# Patient Record
Sex: Female | Born: 1980 | Race: Black or African American | Hispanic: No | Marital: Single | Smoking: Never smoker
Health system: Southern US, Community
[De-identification: ages and names within clinical notes are randomized; demographics above are authoritative.]

## PROBLEM LIST (undated history)

## (undated) ENCOUNTER — Inpatient Hospital Stay (HOSPITAL_COMMUNITY): Payer: Self-pay

## (undated) DIAGNOSIS — D649 Anemia, unspecified: Secondary | ICD-10-CM

## (undated) DIAGNOSIS — F32A Depression, unspecified: Secondary | ICD-10-CM

## (undated) DIAGNOSIS — F329 Major depressive disorder, single episode, unspecified: Secondary | ICD-10-CM

## (undated) DIAGNOSIS — Z8619 Personal history of other infectious and parasitic diseases: Secondary | ICD-10-CM

## (undated) DIAGNOSIS — I10 Essential (primary) hypertension: Secondary | ICD-10-CM

## (undated) DIAGNOSIS — M797 Fibromyalgia: Secondary | ICD-10-CM

## (undated) DIAGNOSIS — T7840XA Allergy, unspecified, initial encounter: Secondary | ICD-10-CM

## (undated) DIAGNOSIS — K311 Adult hypertrophic pyloric stenosis: Secondary | ICD-10-CM

## (undated) HISTORY — PX: CHOLECYSTECTOMY: SHX55

## (undated) HISTORY — DX: Fibromyalgia: M79.7

## (undated) HISTORY — DX: Major depressive disorder, single episode, unspecified: F32.9

## (undated) HISTORY — DX: Allergy, unspecified, initial encounter: T78.40XA

## (undated) HISTORY — DX: Essential (primary) hypertension: I10

## (undated) HISTORY — DX: Depression, unspecified: F32.A

---
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
## 2003-11-19 ENCOUNTER — Other Ambulatory Visit: Admission: RE | Admit: 2003-11-19 | Discharge: 2003-11-19 | Payer: Self-pay | Admitting: Obstetrics & Gynecology

## 2003-12-20 ENCOUNTER — Inpatient Hospital Stay (HOSPITAL_COMMUNITY): Admission: AD | Admit: 2003-12-20 | Discharge: 2003-12-21 | Payer: Self-pay | Admitting: Obstetrics & Gynecology

## 2004-02-24 ENCOUNTER — Ambulatory Visit (HOSPITAL_COMMUNITY): Admission: RE | Admit: 2004-02-24 | Discharge: 2004-02-24 | Payer: Self-pay | Admitting: Obstetrics and Gynecology

## 2004-07-07 ENCOUNTER — Inpatient Hospital Stay (HOSPITAL_COMMUNITY): Admission: AD | Admit: 2004-07-07 | Discharge: 2004-07-09 | Payer: Self-pay | Admitting: Obstetrics & Gynecology

## 2005-11-20 ENCOUNTER — Emergency Department (HOSPITAL_COMMUNITY): Admission: EM | Admit: 2005-11-20 | Discharge: 2005-11-20 | Payer: Self-pay | Admitting: Emergency Medicine

## 2006-05-09 ENCOUNTER — Emergency Department (HOSPITAL_COMMUNITY): Admission: EM | Admit: 2006-05-09 | Discharge: 2006-05-09 | Payer: Self-pay | Admitting: Emergency Medicine

## 2006-08-19 ENCOUNTER — Ambulatory Visit (HOSPITAL_COMMUNITY): Admission: RE | Admit: 2006-08-19 | Discharge: 2006-08-20 | Payer: Self-pay | Admitting: General Surgery

## 2008-02-24 IMAGING — CT CT ABDOMEN W/ CM
1 of 3 series · 14 of 32 positions shown, 19 images · IV contrast (omnipaque)
Comparison: none

CLINICAL DATA: Epigastric pain.
 ABDOMEN CT WITH CONTRAST ? 05/09/06:
TECHNIQUE: Multidetector CT imaging of the abdomen was performed following the standard protocol during bolus administration of intravenous contrast. 
 Contrast:  125 cc Omnipaque 300 IV.
TECHNIQUE: Multidetector CT imaging of the pelvis was performed following the standard protocol during bolus administration of intravenous contrast.

[Series 2: abd/pel 5.0 b30f · axial · 0.62mm/px · z∈[+930,+1250]mm · 14 of 72 slices shown, 19 images]
[im 4/72  soft-tissue]
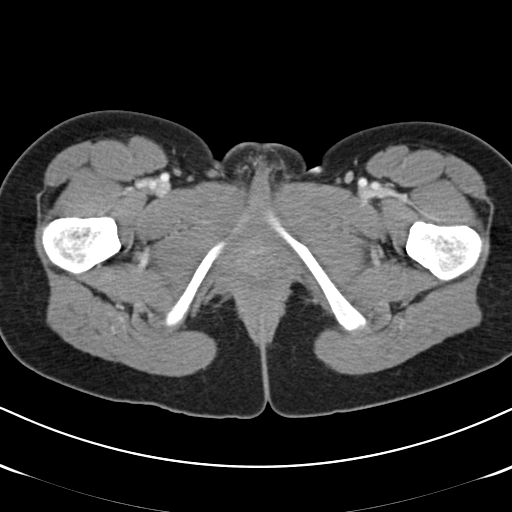
[im 4/72  bone]
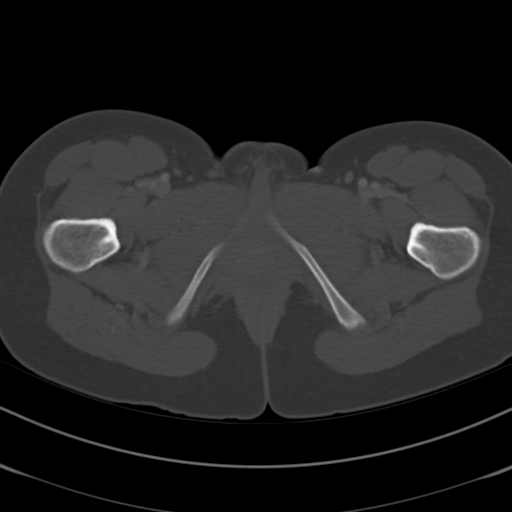
[im 11/72  soft-tissue]
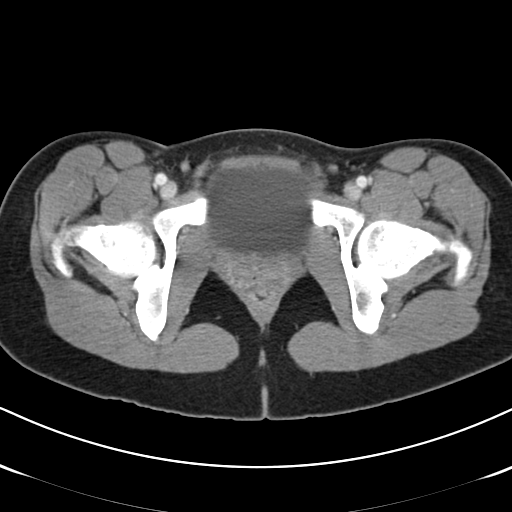
[im 14/72  soft-tissue]
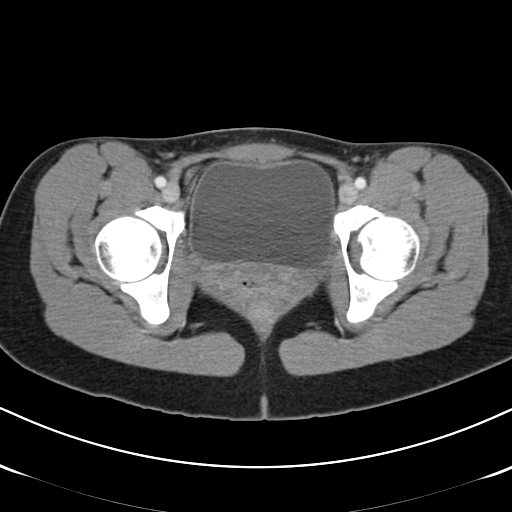
[im 21/72  soft-tissue]
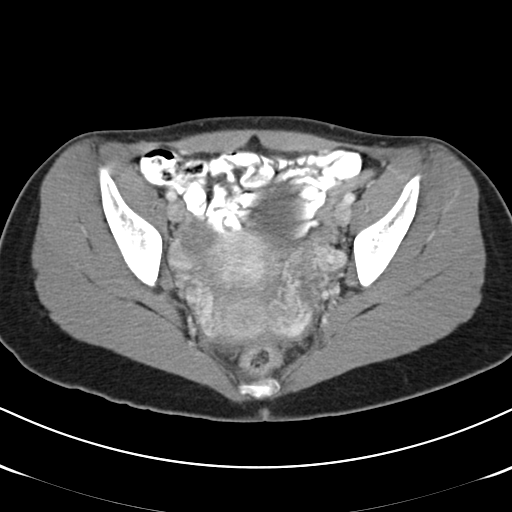
[im 24/72  soft-tissue]
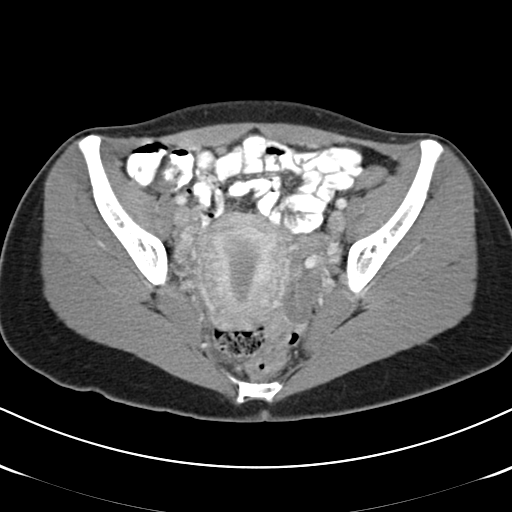
[im 31/72  soft-tissue]
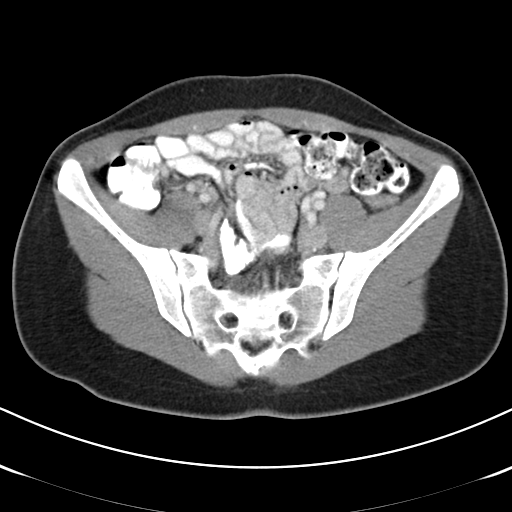
[im 38/72  soft-tissue]
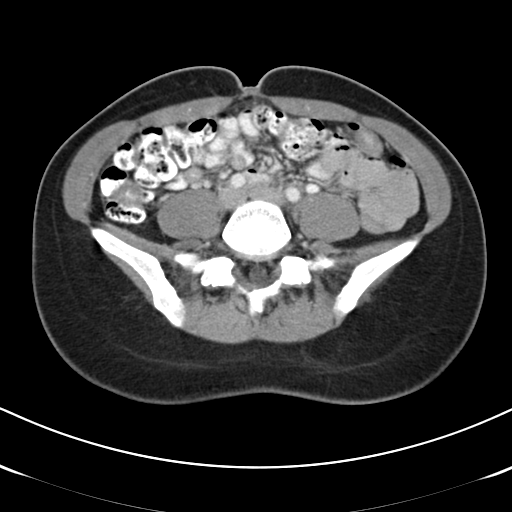
[im 41/72  soft-tissue]
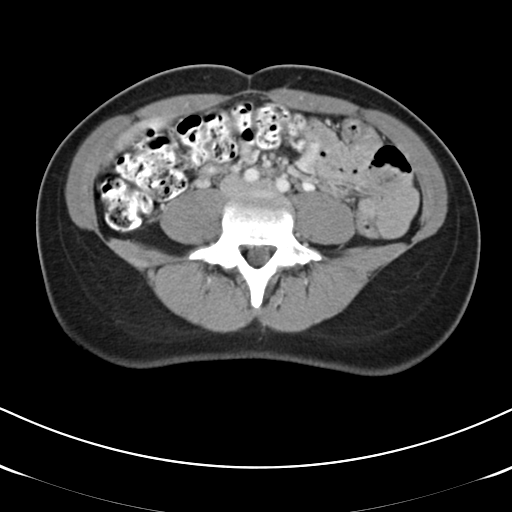
[im 48/72  soft-tissue]
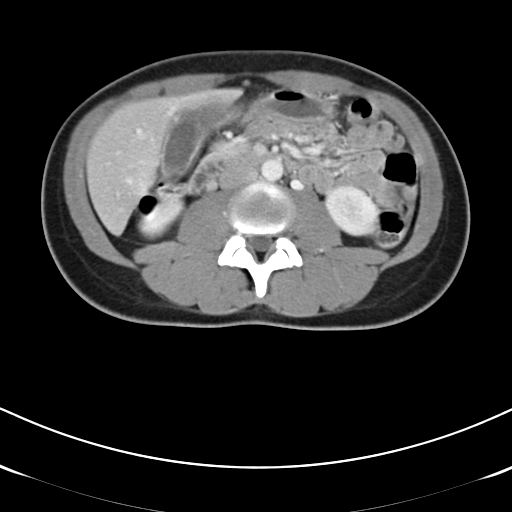
[im 48/72  bone]
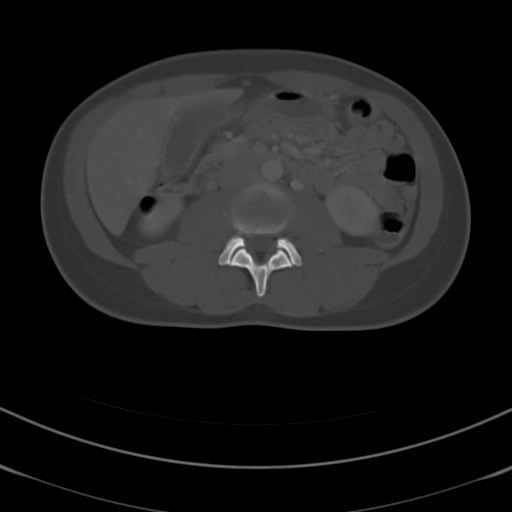
[im 51/72  soft-tissue]
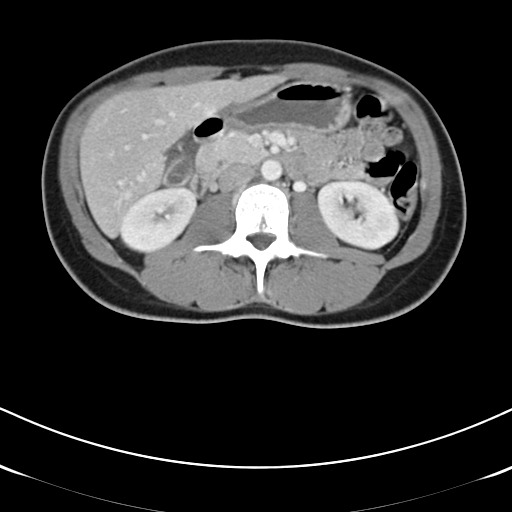
[im 58/72  soft-tissue]
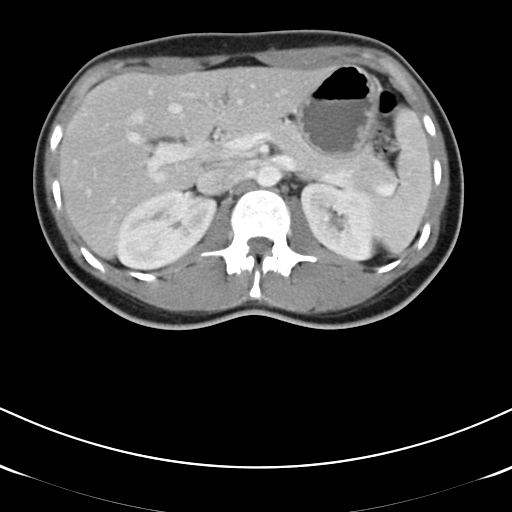
[im 58/72  lung]
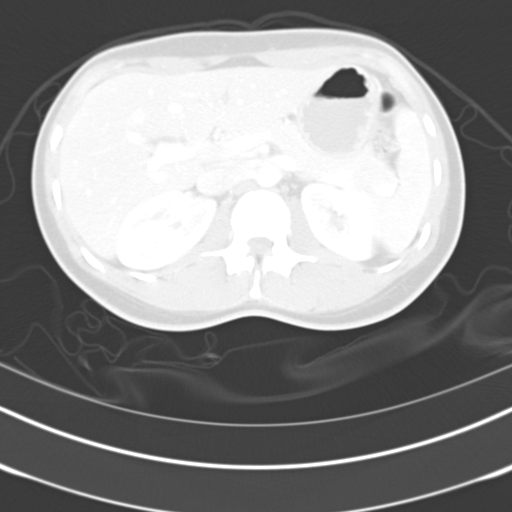
[im 61/72  soft-tissue]
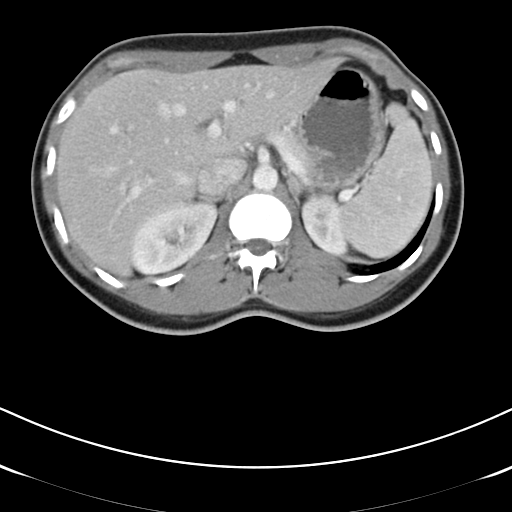
[im 61/72  lung]
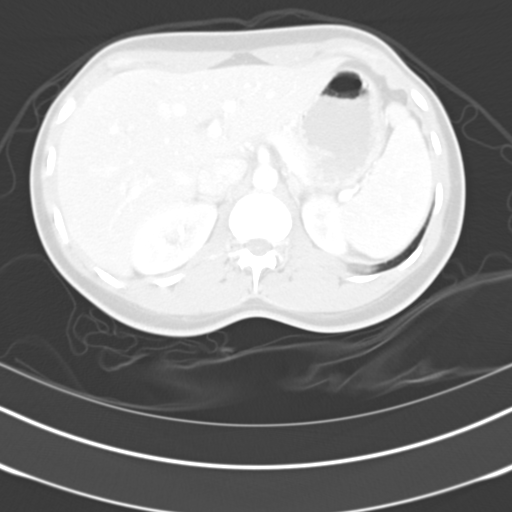
[im 65/72  lung]
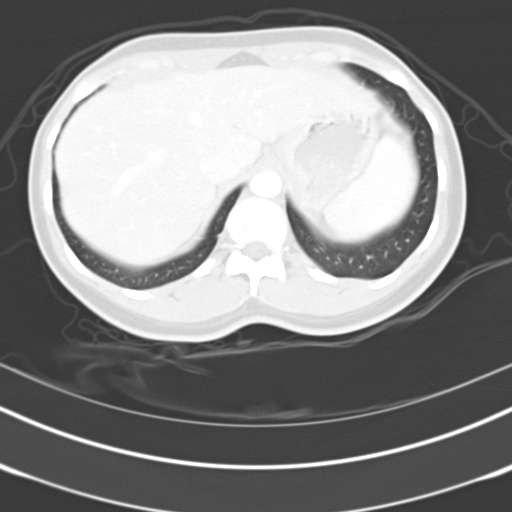
[im 68/72  soft-tissue]
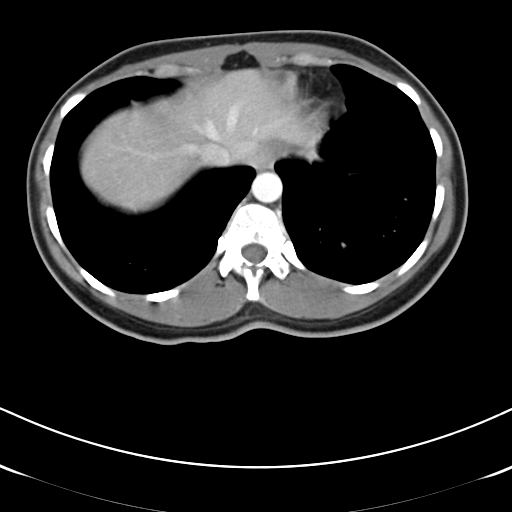
[im 68/72  lung]
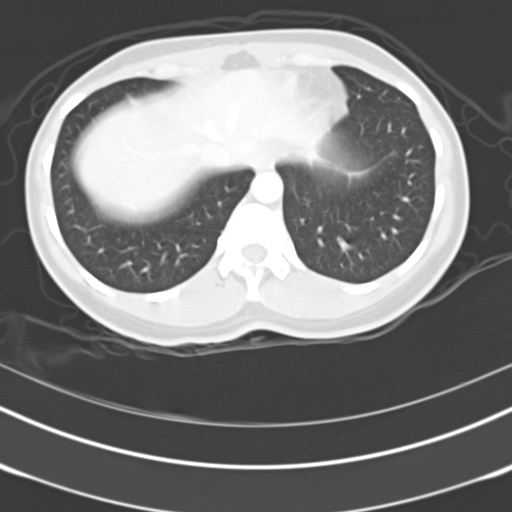

[14 of 32 positions shown; findings below may reference images not displayed]

FINDINGS: The gallbladder wall appears thickened and edematous.  Definite gallstones are not visualized by CT.  However, ultrasound is more sensitive for cholelithiasis and ultrasound of the abdomen is recommended for further evaluation. There is mild intrahepatic biliary dilatation.  The common bile duct is mildly prominent in size measuring 9 mm in diameter.  There are no focal hepatic abnormalities.  The spleen, pancreas, kidneys, and adrenal glands have a normal appearance.  There is no free peritoneal fluid or air.
IMPRESSION: Thickened gallbladder wall ? possible cholecystitis.  Also, mildly dilated common bile duct and intrahepatic biliary radicles.  Recommend ultrasound of the abdomen for further evaluation.
 PELVIS CT WITH CONTRAST ? 05/09/06:
FINDINGS: There is cystic change seen associated with the right ovary with a 3.5 cm in size mildly complicated right ovarian cyst noted.  This would be better evaluated by transvaginal pelvic ultrasound if felt to be indicated.  Minimal free pelvic fluid is noted.  There is no adenopathy.  The urinary bladder has a normal appearance.  The remainder of the CT of pelvis is negative.
IMPRESSION: 3.5 cm in size complicated cystic area associated with the right ovary ? consider transvaginal pelvic ultrasound for further evaluation of this.  Otherwise negative CT of the pelvis.

## 2011-11-24 ENCOUNTER — Other Ambulatory Visit: Payer: Self-pay | Admitting: Obstetrics and Gynecology

## 2012-03-03 ENCOUNTER — Ambulatory Visit (INDEPENDENT_AMBULATORY_CARE_PROVIDER_SITE_OTHER): Payer: Self-pay | Admitting: Internal Medicine

## 2012-03-03 VITALS — BP 161/98 | HR 80 | Temp 98.7°F | Resp 16 | Ht 63.5 in | Wt 158.0 lb

## 2012-03-03 DIAGNOSIS — M791 Myalgia, unspecified site: Secondary | ICD-10-CM

## 2012-03-03 DIAGNOSIS — J029 Acute pharyngitis, unspecified: Secondary | ICD-10-CM

## 2012-03-03 DIAGNOSIS — IMO0001 Reserved for inherently not codable concepts without codable children: Secondary | ICD-10-CM

## 2012-03-03 LAB — POCT CBC
Granulocyte percent: 63 %G (ref 37–80)
MCH, POC: 23.3 pg — AB (ref 27–31.2)
MCHC: 30.1 g/dL — AB (ref 31.8–35.4)
MPV: 8.6 fL (ref 0–99.8)
POC Granulocyte: 5 (ref 2–6.9)
Platelet Count, POC: 508 10*3/uL — AB (ref 142–424)
RBC: 4.61 M/uL (ref 4.04–5.48)
RDW, POC: 16.9 %

## 2012-03-03 LAB — POCT RAPID STREP A (OFFICE): Rapid Strep A Screen: NEGATIVE

## 2012-03-03 MED ORDER — AMOXICILLIN 500 MG PO CAPS: 500.0000 mg | ORAL_CAPSULE | Freq: Three times a day (TID) | ORAL | Status: AC

## 2012-03-03 MED ORDER — FLUCONAZOLE 150 MG PO TABS: 150.0000 mg | ORAL_TABLET | Freq: Once | ORAL | Status: AC

## 2012-03-03 MED ORDER — KETOROLAC TROMETHAMINE 10 MG PO TABS: 10.0000 mg | ORAL_TABLET | Freq: Four times a day (QID) | ORAL | Status: AC | PRN

## 2012-03-03 NOTE — Progress Notes (Signed)
Addended by: Glennie Isle on: 03/03/2012 01:35 PM   Modules accepted: Orders

## 2012-03-03 NOTE — Progress Notes (Signed)
  Subjective:    Patient ID: Kelly Barrett, female    DOB: 10-Sep-1981, 31 y.o.   MRN: [id number]  HPI Sore throat for 3 days  Moderate in severity Also co muscle aches body aches bilateral knee soreness  no fever has chills and    anorexia No radiation of pain in knees  No rash     Review of Systems  Constitutional: Positive for chills, activity change, appetite change and fatigue.  HENT: Negative.   Eyes: Negative.   Respiratory: Negative.   Cardiovascular: Negative.   Gastrointestinal: Negative.   Genitourinary: Negative.   Musculoskeletal: Positive for joint swelling.  Skin: Negative.   Neurological: Negative.   Hematological: Negative.   Psychiatric/Behavioral: Negative.   All other systems reviewed and are negative.       Objective:   Physical Exam  Nursing note and vitals reviewed. Constitutional: She is oriented to person, place, and time. She appears well-developed and well-nourished.  HENT:  Head: Normocephalic and atraumatic.  Right Ear: External ear normal.  Left Ear: External ear normal.       Pharyngeal injection no exudate  Eyes: Conjunctivae and EOM are normal. Pupils are equal, round, and reactive to light.  Neck: Normal range of motion. Neck supple.  Cardiovascular: Normal rate, regular rhythm and normal heart sounds.   Pulmonary/Chest: Effort normal and breath sounds normal.  Abdominal: Soft. Bowel sounds are normal.  Musculoskeletal: Normal range of motion.  Neurological: She is alert and oriented to person, place, and time. She has normal reflexes.  Skin: Skin is warm and dry.  Psychiatric: She has a normal mood and affect. Her behavior is normal. Judgment and thought content normal.     Results for orders placed in visit on 03/03/12  POCT CBC      Component Value Range   WBC 7.9  4.6 - 10.2 (K/uL)   Lymph, poc 2.4  0.6 - 3.4    POC LYMPH PERCENT 30.0  10 - 50 (%L)   MID (cbc) 0.6  0 - 0.9    POC MID % 7.0  0 - 12 (%M)   POC  Granulocyte 5.0  2 - 6.9    Granulocyte percent 63.0  37 - 80 (%G)   RBC 4.61  4.04 - 5.48 (M/uL)   Hemoglobin 10.7 (*) 12.2 - 16.2 (g/dL)   HCT, POC 40.9 (*) 81.1 - 47.9 (%)   MCV 77.2 (*) 80 - 97 (fL)   MCH, POC 23.3 (*) 27 - 31.2 (pg)   MCHC 30.1 (*) 31.8 - 35.4 (g/dL)   RDW, POC 91.4     Platelet Count, POC 508 (*) 142 - 424 (K/uL)   MPV 8.6  0 - 99.8 (fL)  POCT RAPID STREP A (OFFICE)      Component Value Range   Rapid Strep A Screen Negative  Negative       anemia normal wbc strep neg Assessment & Plan:  Pharyngitis will check cbc and strep and treat with appropriate meds.strep neg

## 2012-03-03 NOTE — Patient Instructions (Signed)
Take antibiotic as directed. You must use an alternative form of birth control for one month after taking the antibiotic. Toradol as directed for pain.

## 2012-04-06 ENCOUNTER — Encounter: Payer: Self-pay | Admitting: Family Medicine

## 2013-06-12 ENCOUNTER — Ambulatory Visit (INDEPENDENT_AMBULATORY_CARE_PROVIDER_SITE_OTHER): Payer: 59 | Admitting: Physician Assistant

## 2013-06-12 VITALS — BP 118/72 | HR 88 | Temp 98.9°F | Resp 18 | Ht 64.5 in | Wt 152.8 lb

## 2013-06-12 DIAGNOSIS — J309 Allergic rhinitis, unspecified: Secondary | ICD-10-CM

## 2013-06-12 MED ORDER — IPRATROPIUM BROMIDE 0.03 % NA SOLN: 2.0000 | Freq: Two times a day (BID) | NASAL | Status: DC

## 2013-06-12 MED ORDER — FLUTICASONE PROPIONATE 50 MCG/ACT NA SUSP: 2.0000 | Freq: Every day | NASAL | Status: DC

## 2013-06-12 NOTE — Patient Instructions (Signed)
Please consider getting a flu vaccine.  When your allergy symptoms are controlled, stop the Astelin (ipratropium) nasal spray, but continue the Flonase daily as allergy maintenance.  You may use an over the counter antihistamine, but I recommend that you avoid the decongestant products, which can be more drying.

## 2013-06-12 NOTE — Progress Notes (Signed)
  Subjective:    Patient ID: Kelly Barrett, female    DOB: 04-Aug-1981, 32 y.o.   MRN: [id number]  HPI This 32 y.o. female presents for evaluation of sinus pressure and congestion x 3 days.  History of allergies, but had no symptoms prior to the onset of this.  No cough.  A little ear pressure.  No fever/chills.  Feels achy in the joints (may be underlying fibromyalgia, but she's not sure).  Previously used OTC antihistamine + decongestant combinations, and tried Flonase before.  Allergies are seasonal, mostly in the fall.  Medications, allergies, past medical history, surgical history, family history, social history and problem list reviewed.   Review of Systems As above. No CP, SOB, dizziness.    Objective:   Physical Exam Blood pressure 118/72, pulse 88, temperature 98.9 F (37.2 C), temperature source Oral, resp. rate 18, height 5' 4.5" (1.638 m), weight 152 lb 12.8 oz (69.31 kg), last menstrual period 05/16/2013, SpO2 99.00%. Body mass index is 25.83 kg/(m^2). Well-developed, well nourished BF who is awake, alert and oriented, in NAD. HEENT: [place]/AT, PERRL, EOMI.  Sclera and conjunctiva are clear.  EAC are patent, TMs are normal in appearance. Nasal mucosa is congested, pink and moist. OP is clear. Neck: supple, non-tender, no lymphadenopathy, thyromegaly. Heart: RRR, no murmur Lungs: normal effort, CTA Extremities: no cyanosis, clubbing or edema. Skin: warm and dry without rash. Psychologic: good mood and appropriate affect, normal speech and behavior.        Assessment & Plan:  Allergic rhinitis - Plan: fluticasone (FLONASE) 50 MCG/ACT nasal spray, ipratropium (ATROVENT) 0.03 % nasal spray  Start with both nasal sprays, using the Atrovent followed by the Flonase.  Once her symptoms are controlled, stop the Atrovent and continue the Flonase for maintenance.  Fernande Bras, PA-C Physician Assistant-Certified [facility]

## 2013-06-13 ENCOUNTER — Telehealth: Payer: Self-pay | Admitting: Radiology

## 2013-06-13 NOTE — Telephone Encounter (Signed)
Work note provided for patient

## 2013-09-26 ENCOUNTER — Ambulatory Visit (INDEPENDENT_AMBULATORY_CARE_PROVIDER_SITE_OTHER): Payer: 59 | Admitting: Internal Medicine

## 2013-09-26 VITALS — BP 128/82 | HR 103 | Temp 98.8°F | Resp 17 | Ht 64.5 in | Wt 155.0 lb

## 2013-09-26 DIAGNOSIS — R05 Cough: Secondary | ICD-10-CM

## 2013-09-26 DIAGNOSIS — R059 Cough, unspecified: Secondary | ICD-10-CM

## 2013-09-26 DIAGNOSIS — J020 Streptococcal pharyngitis: Secondary | ICD-10-CM

## 2013-09-26 DIAGNOSIS — J060 Acute laryngopharyngitis: Secondary | ICD-10-CM

## 2013-09-26 MED ORDER — HYDROCODONE-ACETAMINOPHEN 7.5-325 MG/15ML PO SOLN: 10.0000 mL | Freq: Four times a day (QID) | ORAL | Status: DC | PRN

## 2013-09-26 MED ORDER — PREDNISONE 10 MG PO TABS: ORAL_TABLET | ORAL | Status: DC

## 2013-09-26 MED ORDER — AMOXICILLIN 500 MG PO CAPS: 1000.0000 mg | ORAL_CAPSULE | Freq: Two times a day (BID) | ORAL | Status: DC

## 2013-09-26 NOTE — Progress Notes (Signed)
   Subjective:    Patient ID: Kelly Barrett, female    DOB: 1981-03-21, 32 y.o.   MRN: [id number]  HPI  Kelly Barrett is a very pleasant 32 years old female patient who present to [facility] with Headache, sinus pressure, and very bad Cough. Coughing up some clear sputum. Also, complain of shortness of breath. Lost her voice 2 days ago. Denies any Fever, Abdominal pain. Is able to breathe without difficulty except at nite --croup  Review of Systems fibromyalgia    Objective:   Physical Exam  Vitals reviewed. Constitutional: She is oriented to person, place, and time. She appears well-developed and well-nourished. No distress.  HENT:  Head: Normocephalic.  Right Ear: External ear normal.  Left Ear: External ear normal.  Nose: Mucosal edema and rhinorrhea present. No sinus tenderness. No epistaxis. Foreign body is present.  Mouth/Throat: Uvula is midline. No uvula swelling. Posterior oropharyngeal erythema present. No oropharyngeal exudate or posterior oropharyngeal edema.  Eyes: EOM are normal. Pupils are equal, round, and reactive to light.  Cardiovascular: Normal rate, regular rhythm and normal heart sounds.   Pulmonary/Chest: Effort normal and breath sounds normal. No respiratory distress. She has no wheezes. She has no rales. She exhibits no tenderness.  Abdominal: Soft.  Neurological: She is alert and oriented to person, place, and time. No cranial nerve deficit. She exhibits normal muscle tone. Coordination normal.  Psychiatric: She has a normal mood and affect. Her behavior is normal. Thought content normal.    Results for orders placed in visit on 09/26/13  POCT RAPID STREP A (OFFICE)      Result Value Range   Rapid Strep A Screen Positive (*) Negative         Assessment & Plan:  Laryngitis/Cough/Painful throat Lortab elixir Prenisone taper 6d for croup Amoxil for strep

## 2013-09-26 NOTE — Progress Notes (Signed)
   Subjective:    Patient ID: Kelly Barrett, female    DOB: May 07, 1981, 32 y.o.   MRN: [id number]  HPI    Review of Systems     Objective:   Physical Exam        Assessment & Plan:

## 2013-09-26 NOTE — Patient Instructions (Addendum)
Cough, Adult  A cough is a reflex that helps clear your throat and airways. It can help heal the body or may be a reaction to an irritated airway. A cough may only last 2 or 3 weeks (acute) or may last more than 8 weeks (chronic).  CAUSES Acute cough:  Viral or bacterial infections. Chronic cough:  Infections.  Allergies.  Asthma.  Post-nasal drip.  Smoking.  Heartburn or acid reflux.  Some medicines.  Chronic lung problems (COPD).  Cancer. SYMPTOMS   Cough.  Fever.  Chest pain.  Increased breathing rate.  High-pitched whistling sound when breathing (wheezing).  Colored mucus that you cough up (sputum). TREATMENT   A bacterial cough may be treated with antibiotic medicine.  A viral cough must run its course and will not respond to antibiotics.  Your caregiver may recommend other treatments if you have a chronic cough. HOME CARE INSTRUCTIONS   Only take over-the-counter or prescription medicines for pain, discomfort, or fever as directed by your caregiver. Use cough suppressants only as directed by your caregiver.  Use a cold steam vaporizer or humidifier in your bedroom or home to help loosen secretions.  Sleep in a semi-upright position if your cough is worse at night.  Rest as needed.  Stop smoking if you smoke. SEEK IMMEDIATE MEDICAL CARE IF:   You have pus in your sputum.  Your cough starts to worsen.  You cannot control your cough with suppressants and are losing sleep.  You begin coughing up blood.  You have difficulty breathing.  You develop pain which is getting worse or is uncontrolled with medicine.  You have a fever. MAKE SURE YOU:   Understand these instructions.  Will watch your condition.  Will get help right away if you are not doing well or get worse. Document Released: 03/12/2011 Document Revised: 12/06/2011 Document Reviewed: 03/12/2011 [facility] Patient Information 2014 [place], [place]. Laryngitis At the top of your  windpipe is your voice box. It is the source of your voice. Inside your voice box are 2 bands of muscles called vocal cords. When you breathe, your vocal cords are relaxed and open so that air can get into the lungs. When you decide to say something, these cords come together and vibrate. The sound from these vibrations goes into your throat and comes out through your mouth as sound. Laryngitis is an inflammation of the vocal cords that causes hoarseness, cough, loss of voice, sore throat, and dry throat. Laryngitis can be temporary (acute) or long-term (chronic). Most cases of acute laryngitis improve with time.Chronic laryngitis lasts for more than 3 weeks. CAUSES Laryngitis can often be related to excessive smoking, talking, or yelling, as well as inhalation of toxic fumes and allergies. Acute laryngitis is usually caused by a viral infection, vocal strain, measles or mumps, or bacterial infections. Chronic laryngitis is usually caused by vocal cord strain, vocal cord injury, postnasal drip, growths on the vocal cords, or acid reflux. SYMPTOMS   Cough.  Sore throat.  Dry throat. RISK FACTORS  Respiratory infections.  Exposure to irritating substances, such as cigarette smoke, excessive amounts of alcohol, stomach acids, and workplace chemicals.  Voice trauma, such as vocal cord injury from shouting or speaking too loud. DIAGNOSIS  Your cargiver will perform a physical exam. During the physical exam, your caregiver will examine your throat. The most common sign of laryngitis is hoarseness. Laryngoscopy may be necessary to confirm the diagnosis of this condition. This procedure allows your caregiver to look into the  larynx. HOME CARE INSTRUCTIONS  Drink enough fluids to keep your urine clear or pale yellow.  Rest until you no longer have symptoms or as directed by your caregiver.  Breathe in moist air.  Take all medicine as directed by your caregiver.  Do not smoke.  Talk as little  as possible (this includes whispering).  Write on paper instead of talking until your voice is back to normal.  Follow up with your caregiver if your condition has not improved after 10 days. SEEK MEDICAL CARE IF:   You have trouble breathing.  You cough up blood.  You have persistent fever.  You have increasing pain.  You have difficulty swallowing. MAKE SURE YOU:  Understand these instructions.  Will watch your condition.  Will get help right away if you are not doing well or get worse. Document Released: 09/13/2005 Document Revised: 12/06/2011 Document Reviewed: 11/19/2010 [facility] Patient Information 2014 [place], [place]. Strep Throat Strep throat is an infection of the throat caused by a bacteria named Streptococcus pyogenes. Your caregiver may call the infection streptococcal "tonsillitis" or "pharyngitis" depending on whether there are signs of inflammation in the tonsils or back of the throat. Strep throat is most common in children aged 5 15 years during the cold months of the year, but it can occur in people of any age during any season. This infection is spread from person to person (contagious) through coughing, sneezing, or other close contact. SYMPTOMS   Fever or chills.  Painful, swollen, red tonsils or throat.  Pain or difficulty when swallowing.  White or yellow spots on the tonsils or throat.  Swollen, tender lymph nodes or "glands" of the neck or under the jaw.  Red rash all over the body (rare). DIAGNOSIS  Many different infections can cause the same symptoms. A test must be done to confirm the diagnosis so the right treatment can be given. A "rapid strep test" can help your caregiver make the diagnosis in a few minutes. If this test is not available, a light swab of the infected area can be used for a throat culture test. If a throat culture test is done, results are usually available in a day or two. TREATMENT  Strep throat is treated with antibiotic  medicine. HOME CARE INSTRUCTIONS   Gargle with 1 tsp of salt in 1 cup of warm water, 3 4 times per day or as needed for comfort.  Family members who also have a sore throat or fever should be tested for strep throat and treated with antibiotics if they have the strep infection.  Make sure everyone in your household washes their hands well.  Do not share food, drinking cups, or personal items that could cause the infection to spread to others.  You may need to eat a soft food diet until your sore throat gets better.  Drink enough water and fluids to keep your urine clear or pale yellow. This will help prevent dehydration.  Get plenty of rest.  Stay home from school, daycare, or work until you have been on antibiotics for 24 hours.  Only take over-the-counter or prescription medicines for pain, discomfort, or fever as directed by your caregiver.  If antibiotics are prescribed, take them as directed. Finish them even if you start to feel better. SEEK MEDICAL CARE IF:   The glands in your neck continue to enlarge.  You develop a rash, cough, or earache.  You cough up green, yellow-brown, or bloody sputum.  You have pain or discomfort  not controlled by medicines.  Your problems seem to be getting worse rather than better. SEEK IMMEDIATE MEDICAL CARE IF:   You develop any new symptoms such as vomiting, severe headache, stiff or painful neck, chest pain, shortness of breath, or trouble swallowing.  You develop severe throat pain, drooling, or changes in your voice.  You develop swelling of the neck, or the skin on the neck becomes red and tender.  You have a fever.  You develop signs of dehydration, such as fatigue, dry mouth, and decreased urination.  You become increasingly sleepy, or you cannot wake up completely. Document Released: 09/10/2000 Document Revised: 08/30/2012 Document Reviewed: 11/12/2010 [facility] Patient Information 2014 [place], [place].

## 2013-09-27 NOTE — L&D Delivery Note (Signed)
Delivery Note At 1:51 PM a viable and healthy female was delivered via  (Presentation: Right Occiput; Anterior).  APGAR: 9, 9; weight pending.   Placenta status: Intact, Spontaneous.  Cord: 3V  Anesthesia: Epidural  Episiotomy: None Lacerations: None Suture Repair: None Est. Blood Loss (mL): 300cc  Pt found to be complete after having a deceleration. I was called for delivery. Upon my arrival the patient was placed in lithotomy position. While I was attempting to put on my gloves the infants head delivered to crowning followed by the body. I was able to deliver the infant with one glove and a towel. The infant cried vigorously and was passed to the maternal abdomen. Placenta delivered spontaneously, intact, and 3V cord. Mom & baby are both doing well after delivery  Mom to postpartum.  Baby to Couplet care / Skin to Skin.  Horacio Werth H. 07/10/2014, 2:05 PM

## 2013-12-27 ENCOUNTER — Other Ambulatory Visit: Payer: Self-pay | Admitting: Obstetrics and Gynecology

## 2014-01-10 LAB — OB RESULTS CONSOLE ABO/RH: RH TYPE: POSITIVE

## 2014-01-10 LAB — OB RESULTS CONSOLE RUBELLA ANTIBODY, IGM: Rubella: IMMUNE

## 2014-01-10 LAB — OB RESULTS CONSOLE RPR: RPR: NONREACTIVE

## 2014-01-10 LAB — OB RESULTS CONSOLE ANTIBODY SCREEN: Antibody Screen: NEGATIVE

## 2014-01-10 LAB — OB RESULTS CONSOLE HEPATITIS B SURFACE ANTIGEN: HEP B S AG: NEGATIVE

## 2014-01-10 LAB — OB RESULTS CONSOLE HIV ANTIBODY (ROUTINE TESTING): HIV: NONREACTIVE

## 2014-06-26 ENCOUNTER — Encounter (HOSPITAL_COMMUNITY): Payer: Self-pay | Admitting: General Practice

## 2014-06-26 ENCOUNTER — Observation Stay (HOSPITAL_COMMUNITY)
Admission: AD | Admit: 2014-06-26 | Discharge: 2014-06-27 | Disposition: A | Discharge status: Home / Self Care | Payer: 59 | Source: Ambulatory Visit | Attending: Obstetrics and Gynecology | Admitting: Obstetrics and Gynecology

## 2014-06-26 ENCOUNTER — Other Ambulatory Visit: Payer: Self-pay | Admitting: Obstetrics and Gynecology

## 2014-06-26 DIAGNOSIS — O169 Unspecified maternal hypertension, unspecified trimester: Secondary | ICD-10-CM | POA: Diagnosis not present

## 2014-06-26 DIAGNOSIS — Z3A35 35 weeks gestation of pregnancy: Secondary | ICD-10-CM | POA: Insufficient documentation

## 2014-06-26 DIAGNOSIS — O10013 Pre-existing essential hypertension complicating pregnancy, third trimester: Principal | ICD-10-CM | POA: Insufficient documentation

## 2014-06-26 DIAGNOSIS — O163 Unspecified maternal hypertension, third trimester: Secondary | ICD-10-CM

## 2014-06-26 LAB — PROTEIN / CREATININE RATIO, URINE
CREATININE, URINE: 78.38 mg/dL
PROTEIN CREATININE RATIO: 0.1 (ref 0.00–0.15)
TOTAL PROTEIN, URINE: 8 mg/dL

## 2014-06-26 LAB — CBC
HEMATOCRIT: 29.8 % — AB (ref 36.0–46.0)
HEMOGLOBIN: 9.1 g/dL — AB (ref 12.0–15.0)
MCH: 23.1 pg — ABNORMAL LOW (ref 26.0–34.0)
MCHC: 30.5 g/dL (ref 30.0–36.0)
MCV: 75.6 fL — AB (ref 78.0–100.0)
Platelets: 320 10*3/uL (ref 150–400)
RBC: 3.94 MIL/uL (ref 3.87–5.11)
RDW: 17.6 % — AB (ref 11.5–15.5)
WBC: 7.6 10*3/uL (ref 4.0–10.5)

## 2014-06-26 LAB — COMPREHENSIVE METABOLIC PANEL
ALBUMIN: 2.7 g/dL — AB (ref 3.5–5.2)
ALK PHOS: 144 U/L — AB (ref 39–117)
ALT: 23 U/L (ref 0–35)
ANION GAP: 12 (ref 5–15)
AST: 26 U/L (ref 0–37)
BUN: 5 mg/dL — AB (ref 6–23)
CO2: 25 mEq/L (ref 19–32)
Calcium: 8.9 mg/dL (ref 8.4–10.5)
Chloride: 101 mEq/L (ref 96–112)
Creatinine, Ser: 0.67 mg/dL (ref 0.50–1.10)
GFR calc Af Amer: >90 mL/min (ref >90)
GFR calc non Af Amer: >90 mL/min (ref >90)
GLUCOSE: 81 mg/dL (ref 70–99)
Potassium: 3.9 mEq/L (ref 3.7–5.3)
SODIUM: 138 meq/L (ref 137–147)
TOTAL PROTEIN: 6.2 g/dL (ref 6.0–8.3)
Total Bilirubin: 0.2 mg/dL — ABNORMAL LOW (ref 0.3–1.2)

## 2014-06-26 LAB — LACTATE DEHYDROGENASE: LDH: 191 U/L (ref 94–250)

## 2014-06-26 LAB — URIC ACID: Uric Acid, Serum: 3.4 mg/dL (ref 2.4–7.0)

## 2014-06-26 NOTE — H&P (Signed)
Pt is a 33 y/o black female G2P1001 at 35 weeks who was sent over from the office today with elevated B/Ps. She was seen in the ER. She continued to have elevated B/Ps with diastolics in the low 100s. She has no symptoms. Labs were wnl. Will admit for observation and possible early delivery. Pt has a family h.o. HTN. Pt had a normal NIPT PMHX: see hollister PE: HEENT-wnl        Abd-gravid, non tender        EXTs-DTRs wnl        FHTs-reactive IMP/ IUP with HTN PLAN/ Admit for observation and possible induction.

## 2014-06-26 NOTE — MAU Provider Note (Signed)
History     CSN: [id number]  Arrival date and time: 06/26/14 1715   First Provider Initiated Contact with Patient 06/26/14 1924      Chief Complaint  Patient presents with  . Hypertension   HPI  [name] is a 33 y.o. female G2P1001 at 74w0dwho presents with high blood pressure. [facility] was seen in the office today [facility] +fetal movements. Denies leaking of fluid or vaginal bleeding.   OB History   Grav Para Term Preterm Abortions TAB SAB Ect Mult Living   2 1 1       1       Past Medical History  Diagnosis Date  . Allergy   . Fibromyalgia     DX back in HS 2002-2003    Past Surgical History  Procedure Laterality Date  . Cholecystectomy      Family History  Problem Relation Age of Onset  . Hypertension Mother   . Diabetes Father   . Hypertension Sister   . Asthma Son   . Allergies Son     History  Substance Use Topics  . Smoking status: Never Smoker   . Smokeless tobacco: Never Used  . Alcohol Use: No    Allergies: No Known Allergies  Prescriptions prior to admission  Medication Sig Dispense Refill  . amoxicillin (AMOXIL) 500 MG capsule Take 2 capsules (1,000 mg total) by mouth 2 (two) times daily.  40 capsule  0  . HYDROcodone-acetaminophen (HYCET) 7.5-325 mg/15 ml solution Take 10-15 mLs by mouth every 6 (six) hours as needed.  120 mL  0  . predniSONE (DELTASONE) 10 MG tablet 6-5-4-3-2-1 po pc for breathing  21 tablet  0   Results for orders placed during the hospital encounter of 06/26/14 (from the past 48 hour(s))  PROTEIN / CREATININE RATIO, URINE     Status: None   Collection Time    06/26/14  5:45 PM      Result Value Ref Range   Creatinine, Urine 78.38     Total Protein, Urine 8     Comment: NO NORMAL RANGE ESTABLISHED FOR THIS TEST   Protein Creatinine Ratio 0.10  0.00 - 0.15  CBC     Status: Abnormal   Collection Time    06/26/14  5:52 PM      Result Value Ref Range   WBC 7.6  4.0 - 10.5 K/uL   RBC 3.94  3.87 -  5.11 MIL/uL   Hemoglobin 9.1 (*) 12.0 - 15.0 g/dL   HCT 29.8 (*) 36.0 - 46.0 %   MCV 75.6 (*) 78.0 - 100.0 fL   MCH 23.1 (*) 26.0 - 34.0 pg   MCHC 30.5  30.0 - 36.0 g/dL   RDW 17.6 (*) 11.5 - 15.5 %   Platelets 320  150 - 400 K/uL  COMPREHENSIVE METABOLIC PANEL     Status: Abnormal   Collection Time    06/26/14  5:52 PM      Result Value Ref Range   Sodium 138  137 - 147 mEq/L   Potassium 3.9  3.7 - 5.3 mEq/L   Chloride 101  96 - 112 mEq/L   CO2 25  19 - 32 mEq/L   Glucose, Bld 81  70 - 99 mg/dL   BUN 5 (*) 6 - 23 mg/dL   Creatinine, Ser 0.67  0.50 - 1.10 mg/dL   Calcium 8.9  8.4 - 10.5 mg/dL   Total Protein 6.2  6.0 -  8.3 g/dL   Albumin 2.7 (*) 3.5 - 5.2 g/dL   AST 26  0 - 37 U/L   ALT 23  0 - 35 U/L   Alkaline Phosphatase 144 (*) 39 - 117 U/L   Total Bilirubin 0.2 (*) 0.3 - 1.2 mg/dL   GFR calc non Af Amer >90  >90 mL/min   GFR calc Af Amer >90  >90 mL/min   Comment: (NOTE)     The eGFR has been calculated using the CKD EPI equation.     This calculation has not been validated in all clinical situations.     eGFR's persistently <90 mL/min signify possible Chronic Kidney     Disease.   Anion gap 12  5 - 15  URIC ACID     Status: None   Collection Time    06/26/14  5:52 PM      Result Value Ref Range   Uric Acid, Serum 3.4  2.4 - 7.0 mg/dL  LACTATE DEHYDROGENASE     Status: None   Collection Time    06/26/14  5:52 PM      Result Value Ref Range   LDH 191  94 - 250 U/L     Review of Systems  Constitutional: Negative for fever and chills.  Eyes: Negative for blurred vision.  Cardiovascular: Negative for chest pain and leg swelling.  Gastrointestinal: Negative for nausea, vomiting and abdominal pain.  Neurological: Positive for dizziness (occasional ). Negative for headaches.   Physical Exam   Blood pressure 146/105, pulse 89, last menstrual period 11/07/2013.  Physical Exam  Constitutional: She is oriented to person, place, and time. She appears well-developed  and well-nourished. No distress.  HENT:  Head: Normocephalic.  Eyes: Pupils are equal, round, and reactive to light.  Neck: Neck supple.  Cardiovascular: Normal rate and normal heart sounds.   Respiratory: Effort normal and breath sounds normal.  GI: Soft. She exhibits no distension. There is no tenderness. There is no rebound and no guarding.  Musculoskeletal: Normal range of motion.       Right ankle: She exhibits no swelling.       Left ankle: She exhibits no swelling.  Neurological: She is alert and oriented to person, place, and time. She has normal reflexes.  Skin: Skin is warm. She is not diaphoretic.  Psychiatric: Her behavior is normal.    Fetal Tracing: Baseline: 120 bpm  Variability: moderate  Accelerations: 15x15 Decelerations: none Toco: Irregular contraction pattern.   MAU Course  Procedures None  MDM Protein/creatine  Uric acid CBC CMP  Discussed labs with Dr. Ouida Sills.  Dr. Ouida Sills spoke to RN> admit to [facility]   Assessment and Plan   A: Pregnancy induced hypertension  P: Admit to [facility] per Dr. Ouida Sills.   RASCH, [place] 06/26/2014, 7:25 PM

## 2014-06-26 NOTE — MAU Note (Signed)
Pt sent from office to r/o hypertension

## 2014-06-27 DIAGNOSIS — O1013 Pre-existing hypertensive heart disease complicating the puerperium: Secondary | ICD-10-CM | POA: Diagnosis present

## 2014-06-27 DIAGNOSIS — O10013 Pre-existing essential hypertension complicating pregnancy, third trimester: Secondary | ICD-10-CM | POA: Diagnosis not present

## 2014-06-27 DIAGNOSIS — Z3A35 35 weeks gestation of pregnancy: Secondary | ICD-10-CM | POA: Diagnosis not present

## 2014-06-27 NOTE — Discharge Summary (Signed)
Obstetric Discharge Summary Reason for Admission: observation/evaluation Prenatal Procedures: NST and ultrasound Intrapartum Procedures: NA, undelivered Postpartum Procedures: NA, undelivered Complications-Operative and Postpartum: NA undelivered Hemoglobin  Date Value Ref Range Status  06/26/2014 9.1* 12.0 - 15.0 g/dL Final  06/01/2840 32.4* 40.1 - 16.2 g/dL Final     HCT  Date Value Ref Range Status  06/26/2014 29.8* 36.0 - 46.0 % Final     HCT, POC  Date Value Ref Range Status  03/03/2012 35.6* 37.7 - 47.9 % Final    Physical Exam:  General: alert, cooperative and appears stated age FHR: 140-160 reactive, category 1 tracing Abdomen: Soft, gravid, NT  Results for orders placed during the hospital encounter of 06/26/14 (from the past 24 hour(s))  PROTEIN / CREATININE RATIO, URINE     Status: None   Collection Time    06/26/14  5:45 PM      Result Value Ref Range   Creatinine, Urine 78.38     Total Protein, Urine 8     Protein Creatinine Ratio 0.10  0.00 - 0.15  CBC     Status: Abnormal   Collection Time    06/26/14  5:52 PM      Result Value Ref Range   WBC 7.6  4.0 - 10.5 K/uL   RBC 3.94  3.87 - 5.11 MIL/uL   Hemoglobin 9.1 (*) 12.0 - 15.0 g/dL   HCT 02.7 (*) 25.3 - 66.4 %   MCV 75.6 (*) 78.0 - 100.0 fL   MCH 23.1 (*) 26.0 - 34.0 pg   MCHC 30.5  30.0 - 36.0 g/dL   RDW 40.3 (*) 47.4 - 25.9 %   Platelets 320  150 - 400 K/uL  COMPREHENSIVE METABOLIC PANEL     Status: Abnormal   Collection Time    06/26/14  5:52 PM      Result Value Ref Range   Sodium 138  137 - 147 mEq/L   Potassium 3.9  3.7 - 5.3 mEq/L   Chloride 101  96 - 112 mEq/L   CO2 25  19 - 32 mEq/L   Glucose, Bld 81  70 - 99 mg/dL   BUN 5 (*) 6 - 23 mg/dL   Creatinine, Ser 5.63  0.50 - 1.10 mg/dL   Calcium 8.9  8.4 - 87.5 mg/dL   Total Protein 6.2  6.0 - 8.3 g/dL   Albumin 2.7 (*) 3.5 - 5.2 g/dL   AST 26  0 - 37 U/L   ALT 23  0 - 35 U/L   Alkaline Phosphatase 144 (*) 39 - 117 U/L   Total Bilirubin 0.2  (*) 0.3 - 1.2 mg/dL   GFR calc non Af Amer >90  >90 mL/min   GFR calc Af Amer >90  >90 mL/min   Anion gap 12  5 - 15  URIC ACID     Status: None   Collection Time    06/26/14  5:52 PM      Result Value Ref Range   Uric Acid, Serum 3.4  2.4 - 7.0 mg/dL  LACTATE DEHYDROGENASE     Status: None   Collection Time    06/26/14  5:52 PM      Result Value Ref Range   LDH 191  94 - 250 U/L     Discharge Diagnoses: Gestational Hypertension without Severe Features  Discharge Information: Date: 06/27/2014 Activity: Home bedrest Diet: routine Medications: PNV Condition: stable Instructions: refer to practice specific booklet Discharge to: home Follow-up Information  Follow up with Almon HerculesOSS,Irja Wheless H., MD On 07/01/2014. (Monday @ 2:30 for an NST)    Specialty:  Obstetrics and Gynecology   Contact information:   [address] [place] [address] [phone]       Follow up with HORVATH,MICHELLE A, MD On 07/04/2014. (At 10:45 for an NST)    Specialty:  Obstetrics and Gynecology   Contact information:   [address] [place] [address] [phone]       Follow up with Philip AspenALLAHAN, SIDNEY, DO On 07/08/2014. (at 11:00 for an NST)    Specialty:  Obstetrics and Gynecology   Contact information:   [address] [place] [address] [phone]       Almon HerculesOSS,Dalphine Cowie H. 06/27/2014, 9:21 AM

## 2014-06-27 NOTE — Progress Notes (Signed)
Pt. Is stable and ready to be discharged home. All belongings with the patient. Reviewed discharge instructions and prescriptions reviewed. Pt. Wheeled out via wheelchair to [employer] car. Pt. To follow up at office Monday.

## 2014-06-27 NOTE — Discharge Instructions (Signed)

## 2014-07-04 ENCOUNTER — Inpatient Hospital Stay (HOSPITAL_COMMUNITY)
Admission: AD | Admit: 2014-07-04 | Discharge: 2014-07-04 | Disposition: A | Discharge status: Home / Self Care | Payer: 59 | Source: Ambulatory Visit | Attending: Obstetrics and Gynecology | Admitting: Obstetrics and Gynecology

## 2014-07-04 ENCOUNTER — Telehealth (HOSPITAL_COMMUNITY): Payer: Self-pay | Admitting: *Deleted

## 2014-07-04 ENCOUNTER — Encounter (HOSPITAL_COMMUNITY): Payer: Self-pay | Admitting: *Deleted

## 2014-07-04 DIAGNOSIS — Z3A36 36 weeks gestation of pregnancy: Secondary | ICD-10-CM | POA: Insufficient documentation

## 2014-07-04 DIAGNOSIS — O133 Gestational [pregnancy-induced] hypertension without significant proteinuria, third trimester: Secondary | ICD-10-CM | POA: Insufficient documentation

## 2014-07-04 DIAGNOSIS — O1213 Gestational proteinuria, third trimester: Secondary | ICD-10-CM | POA: Diagnosis present

## 2014-07-04 LAB — COMPREHENSIVE METABOLIC PANEL
ALBUMIN: 2.9 g/dL — AB (ref 3.5–5.2)
ALT: 19 U/L (ref 0–35)
AST: 15 U/L (ref 0–37)
Alkaline Phosphatase: 145 U/L — ABNORMAL HIGH (ref 39–117)
Anion gap: 11 (ref 5–15)
BILIRUBIN TOTAL: 0.3 mg/dL (ref 0.3–1.2)
BUN: 6 mg/dL (ref 6–23)
CHLORIDE: 103 meq/L (ref 96–112)
CO2: 22 mEq/L (ref 19–32)
Calcium: 8.2 mg/dL — ABNORMAL LOW (ref 8.4–10.5)
Creatinine, Ser: 0.63 mg/dL (ref 0.50–1.10)
GFR calc Af Amer: >90 mL/min (ref >90)
GFR calc non Af Amer: >90 mL/min (ref >90)
Glucose, Bld: 68 mg/dL — ABNORMAL LOW (ref 70–99)
POTASSIUM: 4 meq/L (ref 3.7–5.3)
Sodium: 136 mEq/L — ABNORMAL LOW (ref 137–147)
Total Protein: 6.6 g/dL (ref 6.0–8.3)

## 2014-07-04 LAB — CBC
HCT: 30.6 % — ABNORMAL LOW (ref 36.0–46.0)
Hemoglobin: 9.4 g/dL — ABNORMAL LOW (ref 12.0–15.0)
MCH: 23 pg — ABNORMAL LOW (ref 26.0–34.0)
MCHC: 30.7 g/dL (ref 30.0–36.0)
MCV: 75 fL — ABNORMAL LOW (ref 78.0–100.0)
PLATELETS: 318 10*3/uL (ref 150–400)
RBC: 4.08 MIL/uL (ref 3.87–5.11)
RDW: 17.4 % — AB (ref 11.5–15.5)
WBC: 8.5 10*3/uL (ref 4.0–10.5)

## 2014-07-04 LAB — PROTEIN / CREATININE RATIO, URINE
CREATININE, URINE: 287.67 mg/dL
Protein Creatinine Ratio: 0.1 (ref 0.00–0.15)
Total Protein, Urine: 27.7 mg/dL

## 2014-07-04 NOTE — MAU Provider Note (Signed)
Chief Complaint:  Proteinuria       HPI: Kelly Barrett is a 33 y.o. G2P1001 at [redacted]w[redacted]d who is sent over after office visit for further  evaluation for preeclampsia due to dipstick proteinuria. Mild H/A this am, still mild but declines tx now. No visual disturbance or upper abd pain.  Denies contractions, leakage of fluid or vaginal bleeding. Good fetal movement.   Pregnancy Course: BP elevations x 1 wk, fibroid  Past Medical History: Past Medical History  Diagnosis Date  . Allergy   . Fibromyalgia     DX back in HS 2002-2003    Past obstetric history: OB History  Gravida Para Term Preterm AB SAB TAB Ectopic Multiple Living  2 1 1       1     # Outcome Date GA Lbr Len/2nd Weight Sex Delivery Anes PTL Lv  2 CUR           1 TRM 07/07/04    M SVD EPI N Y      Past Surgical History: Past Surgical History  Procedure Laterality Date  . Cholecystectomy       Family History: Family History  Problem Relation Age of Onset  . Hypertension Mother   . Diabetes Father   . Hypertension Sister   . Asthma Son   . Allergies Son     Social History: History  Substance Use Topics  . Smoking status: Never Smoker   . Smokeless tobacco: Never Used  . Alcohol Use: No    Allergies: No Known Allergies  Meds:  Prescriptions prior to admission  Medication Sig Dispense Refill  . FeBisg-FeCbn-C-FA-B12-Biot-DSS (FERIVA PO) Take 1 capsule by mouth daily.      . Prenatal Vit-Fe Fumarate-FA (PRENATAL MULTIVITAMIN) TABS tablet Take 1 tablet by mouth daily at 12 noon.        ROS: Pertinent findings in history of present illness.  Physical Exam  Height 5\' 3"  (1.6 m), weight 71.759 kg (158 lb 3.2 oz), last menstrual period 11/07/2013. Filed Vitals:   07/04/14 1227 07/04/14 1242 07/04/14 1257 07/04/14 1312  BP: 141/100 136/88 144/96 140/94  Pulse: 100 87 95 103  Temp:      TempSrc:      Resp:      Height:      Weight:       GENERAL: Well-developed, well-nourished female in no acute  distress.  HEENT: normocephalic HEART: normal rate RESP: normal effort ABDOMEN: Soft, non-tender, gravid appropriate for gestational age, cephalic by exam EXTREMITIES: Nontender, no edema NEURO: alert and oriented     FHT:  Baseline 120-125 , moderate variability, accelerations present, no decelerations Contractions: occ, mild   Labs: Results for orders placed during the hospital encounter of 07/04/14 (from the past 24 hour(s))  PROTEIN / CREATININE RATIO, URINE     Status: None   Collection Time    07/04/14 12:00 PM      Result Value Ref Range   Creatinine, Urine 287.67     Total Protein, Urine 27.7     Protein Creatinine Ratio 0.10  0.00 - 0.15  CBC     Status: Abnormal   Collection Time    07/04/14 12:30 PM      Result Value Ref Range   WBC 8.5  4.0 - 10.5 K/uL   RBC 4.08  3.87 - 5.11 MIL/uL   Hemoglobin 9.4 (*) 12.0 - 15.0 g/dL   HCT 16.1 (*) 09.6 - 04.5 %   MCV 75.0 (*) 78.0 -  100.0 fL   MCH 23.0 (*) 26.0 - 34.0 pg   MCHC 30.7  30.0 - 36.0 g/dL   RDW 16.117.4 (*) 09.611.5 - 04.515.5 %   Platelets 318  150 - 400 K/uL  COMPREHENSIVE METABOLIC PANEL     Status: Abnormal   Collection Time    07/04/14 12:30 PM      Result Value Ref Range   Sodium 136 (*) 137 - 147 mEq/L   Potassium 4.0  3.7 - 5.3 mEq/L   Chloride 103  96 - 112 mEq/L   CO2 22  19 - 32 mEq/L   Glucose, Bld 68 (*) 70 - 99 mg/dL   BUN 6  6 - 23 mg/dL   Creatinine, Ser 4.090.63  0.50 - 1.10 mg/dL   Calcium 8.2 (*) 8.4 - 10.5 mg/dL   Total Protein 6.6  6.0 - 8.3 g/dL   Albumin 2.9 (*) 3.5 - 5.2 g/dL   AST 15  0 - 37 U/L   ALT 19  0 - 35 U/L   Alkaline Phosphatase 145 (*) 39 - 117 U/L   Total Bilirubin 0.3  0.3 - 1.2 mg/dL   GFR calc non Af Amer >90  >90 mL/min   GFR calc Af Amer >90  >90 mL/min   Anion gap 11  5 - 15    Imaging:  No results found. MAU Course: Labs reassuring H/A resolved after eating C/W Dr. Henderson CloudHorvath  Assessment: 1. Gestational hypertension w/o significant proteinuria in 3rd trimester    G2P1001 @[redacted]w[redacted]d  Cat 1 FHR  Plan: Discharge home Labor precautions and fetal kick counts    Medication List         FERIVA PO  Take 1 capsule by mouth daily.     prenatal multivitamin Tabs tablet  Take 1 tablet by mouth daily at 12 noon.       Follow-up Information   Follow up with Lancelot Alyea A, MD On 07/08/2014.   Specialty:  Obstetrics and Gynecology   Contact information:   [address] [place] [address] [phone]      Danae Orleanseirdre C Poe, CNM 07/04/2014 12:13 PM

## 2014-07-04 NOTE — Discharge Instructions (Signed)

## 2014-07-04 NOTE — Telephone Encounter (Signed)
Preadmission screen  

## 2014-07-04 NOTE — MAU Note (Signed)
Patient states she was seen in the office this am and had protein in the urine with slightly elevated blood pressure. Denies contraction, bleeding or leaking and reports good fetal movement.

## 2014-07-10 ENCOUNTER — Encounter (HOSPITAL_COMMUNITY): Payer: 59 | Admitting: Anesthesiology

## 2014-07-10 ENCOUNTER — Inpatient Hospital Stay (HOSPITAL_COMMUNITY): Payer: 59 | Admitting: Anesthesiology

## 2014-07-10 ENCOUNTER — Encounter (HOSPITAL_COMMUNITY): Payer: Self-pay

## 2014-07-10 ENCOUNTER — Inpatient Hospital Stay (HOSPITAL_COMMUNITY)
Admission: RE | Admit: 2014-07-10 | Discharge: 2014-07-12 | DRG: 767 | Disposition: A | Discharge status: Home / Self Care | Payer: 59 | Source: Ambulatory Visit | Attending: Obstetrics and Gynecology | Admitting: Obstetrics and Gynecology

## 2014-07-10 DIAGNOSIS — Z302 Encounter for sterilization: Secondary | ICD-10-CM

## 2014-07-10 DIAGNOSIS — O133 Gestational [pregnancy-induced] hypertension without significant proteinuria, third trimester: Secondary | ICD-10-CM | POA: Diagnosis present

## 2014-07-10 DIAGNOSIS — O9902 Anemia complicating childbirth: Secondary | ICD-10-CM | POA: Diagnosis present

## 2014-07-10 DIAGNOSIS — D649 Anemia, unspecified: Secondary | ICD-10-CM | POA: Diagnosis present

## 2014-07-10 DIAGNOSIS — Z3A37 37 weeks gestation of pregnancy: Secondary | ICD-10-CM | POA: Diagnosis present

## 2014-07-10 HISTORY — DX: Adult hypertrophic pyloric stenosis: K31.1

## 2014-07-10 HISTORY — DX: Anemia, unspecified: D64.9

## 2014-07-10 HISTORY — DX: Personal history of other infectious and parasitic diseases: Z86.19

## 2014-07-10 LAB — CBC
HCT: 29.3 % — ABNORMAL LOW (ref 36.0–46.0)
Hemoglobin: 9.2 g/dL — ABNORMAL LOW (ref 12.0–15.0)
MCH: 23.3 pg — ABNORMAL LOW (ref 26.0–34.0)
MCHC: 31.4 g/dL (ref 30.0–36.0)
MCV: 74.2 fL — AB (ref 78.0–100.0)
PLATELETS: 348 10*3/uL (ref 150–400)
RBC: 3.95 MIL/uL (ref 3.87–5.11)
RDW: 17 % — ABNORMAL HIGH (ref 11.5–15.5)
WBC: 8.1 10*3/uL (ref 4.0–10.5)

## 2014-07-10 LAB — RPR

## 2014-07-10 LAB — TYPE AND SCREEN
ABO/RH(D): A POS
Antibody Screen: NEGATIVE

## 2014-07-10 LAB — ABO/RH: ABO/RH(D): A POS

## 2014-07-10 MED ORDER — OXYCODONE-ACETAMINOPHEN 5-325 MG PO TABS
2.0000 | ORAL_TABLET | ORAL | Status: DC | PRN
  Administered 2014-07-12: 2 via ORAL
  Filled 2014-07-10: qty 2

## 2014-07-10 MED ORDER — ONDANSETRON HCL 4 MG PO TABS: 4.0000 mg | ORAL_TABLET | ORAL | Status: DC | PRN

## 2014-07-10 MED ORDER — PHENYLEPHRINE 40 MCG/ML (10ML) SYRINGE FOR IV PUSH (FOR BLOOD PRESSURE SUPPORT)
80.0000 ug | PREFILLED_SYRINGE | INTRAVENOUS | Status: DC | PRN
  Filled 2014-07-10: qty 2

## 2014-07-10 MED ORDER — ZOLPIDEM TARTRATE 5 MG PO TABS: 5.0000 mg | ORAL_TABLET | Freq: Every evening | ORAL | Status: DC | PRN

## 2014-07-10 MED ORDER — LIDOCAINE HCL (PF) 1 % IJ SOLN
30.0000 mL | INTRAMUSCULAR | Status: DC | PRN
  Filled 2014-07-10: qty 30

## 2014-07-10 MED ORDER — BENZOCAINE-MENTHOL 20-0.5 % EX AERO
1.0000 "application " | INHALATION_SPRAY | CUTANEOUS | Status: DC | PRN
  Filled 2014-07-10: qty 56

## 2014-07-10 MED ORDER — SENNOSIDES-DOCUSATE SODIUM 8.6-50 MG PO TABS
2.0000 | ORAL_TABLET | ORAL | Status: DC
  Administered 2014-07-10 – 2014-07-12 (×2): 2 via ORAL
  Filled 2014-07-10 (×2): qty 2

## 2014-07-10 MED ORDER — ONDANSETRON HCL 4 MG/2ML IJ SOLN: 4.0000 mg | INTRAMUSCULAR | Status: DC | PRN

## 2014-07-10 MED ORDER — PHENYLEPHRINE 40 MCG/ML (10ML) SYRINGE FOR IV PUSH (FOR BLOOD PRESSURE SUPPORT)
80.0000 ug | PREFILLED_SYRINGE | INTRAVENOUS | Status: DC | PRN
  Filled 2014-07-10: qty 2
  Filled 2014-07-10: qty 10

## 2014-07-10 MED ORDER — DIPHENHYDRAMINE HCL 50 MG/ML IJ SOLN: 12.5000 mg | INTRAMUSCULAR | Status: DC | PRN

## 2014-07-10 MED ORDER — DIBUCAINE 1 % RE OINT: 1.0000 "application " | TOPICAL_OINTMENT | RECTAL | Status: DC | PRN

## 2014-07-10 MED ORDER — WITCH HAZEL-GLYCERIN EX PADS: 1.0000 "application " | MEDICATED_PAD | CUTANEOUS | Status: DC | PRN

## 2014-07-10 MED ORDER — EPHEDRINE 5 MG/ML INJ
10.0000 mg | INTRAVENOUS | Status: DC | PRN
  Filled 2014-07-10: qty 2

## 2014-07-10 MED ORDER — OXYTOCIN 40 UNITS IN LACTATED RINGERS INFUSION - SIMPLE MED: 62.5000 mL/h | INTRAVENOUS | Status: DC

## 2014-07-10 MED ORDER — TERBUTALINE SULFATE 1 MG/ML IJ SOLN: 0.2500 mg | Freq: Once | INTRAMUSCULAR | Status: DC | PRN

## 2014-07-10 MED ORDER — FENTANYL 2.5 MCG/ML BUPIVACAINE 1/10 % EPIDURAL INFUSION (WH - ANES)
14.0000 mL/h | INTRAMUSCULAR | Status: DC | PRN
  Administered 2014-07-10: 14 mL/h via EPIDURAL
  Filled 2014-07-10: qty 125

## 2014-07-10 MED ORDER — LIDOCAINE HCL (PF) 1 % IJ SOLN
INTRAMUSCULAR | Status: DC | PRN
  Administered 2014-07-10 (×3): 4 mL

## 2014-07-10 MED ORDER — PRENATAL MULTIVITAMIN CH: 1.0000 | ORAL_TABLET | Freq: Every day | ORAL | Status: DC

## 2014-07-10 MED ORDER — OXYCODONE-ACETAMINOPHEN 5-325 MG PO TABS: 2.0000 | ORAL_TABLET | ORAL | Status: DC | PRN

## 2014-07-10 MED ORDER — LANOLIN HYDROUS EX OINT: TOPICAL_OINTMENT | CUTANEOUS | Status: DC | PRN

## 2014-07-10 MED ORDER — LACTATED RINGERS IV SOLN
500.0000 mL | Freq: Once | INTRAVENOUS | Status: AC
  Administered 2014-07-10: 500 mL via INTRAVENOUS

## 2014-07-10 MED ORDER — BUTORPHANOL TARTRATE 1 MG/ML IJ SOLN: 1.0000 mg | INTRAMUSCULAR | Status: DC | PRN

## 2014-07-10 MED ORDER — SIMETHICONE 80 MG PO CHEW
80.0000 mg | CHEWABLE_TABLET | ORAL | Status: DC | PRN
  Administered 2014-07-11: 80 mg via ORAL
  Filled 2014-07-10: qty 1

## 2014-07-10 MED ORDER — OXYCODONE-ACETAMINOPHEN 5-325 MG PO TABS
1.0000 | ORAL_TABLET | ORAL | Status: DC | PRN
  Administered 2014-07-11 – 2014-07-12 (×3): 1 via ORAL
  Filled 2014-07-10 (×3): qty 1

## 2014-07-10 MED ORDER — LACTATED RINGERS IV SOLN
INTRAVENOUS | Status: DC
  Administered 2014-07-10 (×2): via INTRAVENOUS

## 2014-07-10 MED ORDER — IBUPROFEN 600 MG PO TABS
600.0000 mg | ORAL_TABLET | Freq: Four times a day (QID) | ORAL | Status: DC
  Administered 2014-07-10 – 2014-07-12 (×5): 600 mg via ORAL
  Filled 2014-07-10 (×5): qty 1

## 2014-07-10 MED ORDER — OXYCODONE-ACETAMINOPHEN 5-325 MG PO TABS: 1.0000 | ORAL_TABLET | ORAL | Status: DC | PRN

## 2014-07-10 MED ORDER — LACTATED RINGERS IV SOLN: 500.0000 mL | INTRAVENOUS | Status: DC | PRN

## 2014-07-10 MED ORDER — ZOLPIDEM TARTRATE 5 MG PO TABS
5.0000 mg | ORAL_TABLET | Freq: Every evening | ORAL | Status: DC | PRN
Start: 2014-07-10 — End: 2014-07-12

## 2014-07-10 MED ORDER — TETANUS-DIPHTH-ACELL PERTUSSIS 5-2.5-18.5 LF-MCG/0.5 IM SUSP: 0.5000 mL | Freq: Once | INTRAMUSCULAR | Status: DC

## 2014-07-10 MED ORDER — ONDANSETRON HCL 4 MG/2ML IJ SOLN
4.0000 mg | Freq: Four times a day (QID) | INTRAMUSCULAR | Status: DC | PRN
  Administered 2014-07-10: 4 mg via INTRAVENOUS
  Filled 2014-07-10: qty 2

## 2014-07-10 MED ORDER — OXYTOCIN 40 UNITS IN LACTATED RINGERS INFUSION - SIMPLE MED
1.0000 m[IU]/min | INTRAVENOUS | Status: DC
  Administered 2014-07-10: 2 m[IU]/min via INTRAVENOUS
  Filled 2014-07-10: qty 1000

## 2014-07-10 MED ORDER — CITRIC ACID-SODIUM CITRATE 334-500 MG/5ML PO SOLN: 30.0000 mL | ORAL | Status: DC | PRN

## 2014-07-10 MED ORDER — ACETAMINOPHEN 325 MG PO TABS: 650.0000 mg | ORAL_TABLET | ORAL | Status: DC | PRN

## 2014-07-10 MED ORDER — DIPHENHYDRAMINE HCL 25 MG PO CAPS: 25.0000 mg | ORAL_CAPSULE | Freq: Four times a day (QID) | ORAL | Status: DC | PRN

## 2014-07-10 MED ORDER — OXYTOCIN BOLUS FROM INFUSION
500.0000 mL | INTRAVENOUS | Status: DC
  Administered 2014-07-10: 500 mL via INTRAVENOUS

## 2014-07-10 NOTE — Anesthesia Procedure Notes (Signed)
Epidural Patient location during procedure: OB Start time: 07/10/2014 9:30 AM  Staffing Anesthesiologist: Kiyanna Biegler Performed by: anesthesiologist   Preanesthetic Checklist Completed: patient identified, site marked, surgical consent, pre-op evaluation, timeout performed, IV checked, risks and benefits discussed and monitors and equipment checked  Epidural Patient position: sitting Prep: site prepped and draped and DuraPrep Patient monitoring: continuous pulse ox and blood pressure Approach: midline Location: L3-L4 Injection technique: LOR air  Needle:  Needle type: Tuohy  Needle gauge: 17 G Needle length: 9 cm and 9 Needle insertion depth: 4 cm Catheter type: closed end flexible Catheter size: 19 Gauge Catheter at skin depth: 9 cm Test dose: negative  Assessment Events: blood not aspirated, injection not painful, no injection resistance, negative IV test and no paresthesia  Additional Notes Discussed risk of headache, infection, bleeding, nerve injury and failed or incomplete block.  Patient voices understanding and wishes to proceed.  Epidural placed easily on first attempt.  No paresthesia.  Patient tolerated procedure well with no apparent complications.  Kelly Barrett, MDReason for block:procedure for pain

## 2014-07-10 NOTE — Plan of Care (Signed)
Problem: Consults Goal: Birthing Suites Patient Information Press F2 to bring up selections list  Pt 37-[redacted] weeks EGA, Inpatient induction and Other (specify with a note) gestational hypertension

## 2014-07-10 NOTE — Anesthesia Preprocedure Evaluation (Signed)
Anesthesia Evaluation  Patient identified by MRN, date of birth, ID band Patient awake    Reviewed: Allergy & Precautions, H&P , NPO status , Patient's Chart, lab work & pertinent test results, reviewed documented beta blocker date and time   History of Anesthesia Complications Negative for: history of anesthetic complications  Airway Mallampati: I TM Distance: >3 FB Neck ROM: full    Dental  (+) Teeth Intact   Pulmonary neg pulmonary ROS,  breath sounds clear to auscultation        Cardiovascular hypertension (GHTN), Rhythm:regular Rate:Normal     Neuro/Psych negative neurological ROS  negative psych ROS   GI/Hepatic negative GI ROS, Neg liver ROS,   Endo/Other  negative endocrine ROS  Renal/GU negative Renal ROS     Musculoskeletal  (+) Fibromyalgia -  Abdominal   Peds  Hematology  (+) anemia ,   Anesthesia Other Findings   Reproductive/Obstetrics (+) Pregnancy (plans PPBTL)                           Anesthesia Physical Anesthesia Plan  ASA: II  Anesthesia Plan: Epidural   Post-op Pain Management:    Induction:   Airway Management Planned:   Additional Equipment:   Intra-op Plan:   Post-operative Plan:   Informed Consent: I have reviewed the patients History and Physical, chart, labs and discussed the procedure including the risks, benefits and alternatives for the proposed anesthesia with the patient or authorized representative who has indicated his/her understanding and acceptance.     Plan Discussed with:   Anesthesia Plan Comments:         Anesthesia Quick Evaluation

## 2014-07-10 NOTE — Lactation Note (Signed)
This note was copied from the chart of Girl Rockelle Decou. Lactation Consultation Note  Patient Name: Girl Jimmy PicketCharyss Yohannes HQION'GToday's Date: 07/10/2014 Reason for consult: Initial assessment;Infant < 6lbs;Late preterm infant (baby born at 3437 weeks) Mom states she was unable to breastfeed her older child, now 31 yo, because of latching problems.  Mom has breastfed her newborn several times but complains of nipple soreness.  She has short, slightly dimpled nipples but no visible nipple trauma at this time.  She reports that her nurse assisted her with hand expression but it was painful and no ebm obtained.  Baby is early term at 37 weeks but under 6 pounds.  Mom fed some formula after recent breastfeeding attempt.  LC encouraged frequent STS, cue feedings but a minimum of q3h due to baby's weight.  LC discussed plan of care with RN, Chyrl CivatteJoAnn and NT, [place]. Mom encouraged to feed baby 8-12 times/24 hours and with feeding cues. LC encouraged review of Baby and Me pp 9, 14 and 20-25 for STS and BF information. LC provided [employer] Resource brochure and reviewed [facility] services and list of community and web site resources.    Maternal Data Formula Feeding for Exclusion: Yes Reason for exclusion: Mother's choice to formula and breast feed on admission Has patient been taught Hand Expression?: Yes (per RN instruction at 1445 feeding) Does the patient have breastfeeding experience prior to this delivery?: No (first child would not latch)  Feeding Feeding Type: Formula Nipple Type: Slow - flow  LATCH Score/Interventions Latch: Repeated attempts needed to sustain latch, nipple held in mouth throughout feeding, stimulation needed to elicit sucking reflex.          LATCH scores=8 per RN assessment          Lactation Tools Discussed/Used   STS, cue feedings, hand expression after gentle breast massage to encourage her milk flow  Consult Status Consult Status: Follow-up Date: 07/11/14 Follow-up type:  In-patient    Warrick ParisianBryant, Jonika Critz [facility] 07/10/2014, 10:38 PM

## 2014-07-11 ENCOUNTER — Encounter (HOSPITAL_COMMUNITY): Payer: 59 | Admitting: Anesthesiology

## 2014-07-11 ENCOUNTER — Inpatient Hospital Stay (HOSPITAL_COMMUNITY): Payer: 59 | Admitting: Anesthesiology

## 2014-07-11 ENCOUNTER — Encounter (HOSPITAL_COMMUNITY)
Admission: RE | Disposition: A | Discharge status: Home / Self Care | Payer: Self-pay | Source: Ambulatory Visit | Attending: Obstetrics and Gynecology

## 2014-07-11 HISTORY — PX: TUBAL LIGATION: SHX77

## 2014-07-11 LAB — CBC
HCT: 29.3 % — ABNORMAL LOW (ref 36.0–46.0)
HEMOGLOBIN: 9.2 g/dL — AB (ref 12.0–15.0)
MCH: 23.5 pg — ABNORMAL LOW (ref 26.0–34.0)
MCHC: 31.4 g/dL (ref 30.0–36.0)
MCV: 74.7 fL — ABNORMAL LOW (ref 78.0–100.0)
Platelets: 336 10*3/uL (ref 150–400)
RBC: 3.92 MIL/uL (ref 3.87–5.11)
RDW: 17.3 % — ABNORMAL HIGH (ref 11.5–15.5)
WBC: 10 10*3/uL (ref 4.0–10.5)

## 2014-07-11 LAB — SURGICAL PCR SCREEN
MRSA, PCR: NEGATIVE
STAPHYLOCOCCUS AUREUS: NEGATIVE

## 2014-07-11 SURGERY — LIGATION, FALLOPIAN TUBE, POSTPARTUM
Anesthesia: Epidural | Site: Abdomen | Laterality: Bilateral

## 2014-07-11 MED ORDER — DEXAMETHASONE SODIUM PHOSPHATE 4 MG/ML IJ SOLN
INTRAMUSCULAR | Status: AC
  Filled 2014-07-11: qty 1

## 2014-07-11 MED ORDER — PROMETHAZINE HCL 25 MG/ML IJ SOLN: 6.2500 mg | INTRAMUSCULAR | Status: DC | PRN

## 2014-07-11 MED ORDER — LIDOCAINE-EPINEPHRINE (PF) 2 %-1:200000 IJ SOLN
INTRAMUSCULAR | Status: DC | PRN
  Administered 2014-07-11 (×4): 5 mL via EPIDURAL

## 2014-07-11 MED ORDER — INFLUENZA VAC SPLIT QUAD 0.5 ML IM SUSY: 0.5000 mL | PREFILLED_SYRINGE | INTRAMUSCULAR | Status: DC

## 2014-07-11 MED ORDER — MEPERIDINE HCL 25 MG/ML IJ SOLN
6.2500 mg | INTRAMUSCULAR | Status: DC | PRN
Start: 2014-07-11 — End: 2014-07-11

## 2014-07-11 MED ORDER — LACTATED RINGERS IV SOLN: INTRAVENOUS | Status: DC

## 2014-07-11 MED ORDER — METOCLOPRAMIDE HCL 10 MG PO TABS: 10.0000 mg | ORAL_TABLET | Freq: Once | ORAL | Status: DC

## 2014-07-11 MED ORDER — KETOROLAC TROMETHAMINE 30 MG/ML IJ SOLN
INTRAMUSCULAR | Status: AC
  Filled 2014-07-11: qty 1

## 2014-07-11 MED ORDER — METOCLOPRAMIDE HCL 10 MG PO TABS
10.0000 mg | ORAL_TABLET | Freq: Once | ORAL | Status: AC
  Administered 2014-07-11: 10 mg via ORAL
  Filled 2014-07-11: qty 1

## 2014-07-11 MED ORDER — BUPIVACAINE HCL (PF) 0.25 % IJ SOLN
INTRAMUSCULAR | Status: DC | PRN
  Administered 2014-07-11: 8 mL

## 2014-07-11 MED ORDER — BUPIVACAINE HCL (PF) 0.25 % IJ SOLN
INTRAMUSCULAR | Status: AC
  Filled 2014-07-11: qty 30

## 2014-07-11 MED ORDER — LIDOCAINE HCL (CARDIAC) 20 MG/ML IV SOLN
INTRAVENOUS | Status: AC
  Filled 2014-07-11: qty 5

## 2014-07-11 MED ORDER — LIDOCAINE-EPINEPHRINE (PF) 2 %-1:200000 IJ SOLN
INTRAMUSCULAR | Status: AC
  Filled 2014-07-11: qty 20

## 2014-07-11 MED ORDER — MIDAZOLAM HCL 2 MG/2ML IJ SOLN
INTRAMUSCULAR | Status: AC
  Filled 2014-07-11: qty 2

## 2014-07-11 MED ORDER — FAMOTIDINE 20 MG PO TABS
40.0000 mg | ORAL_TABLET | Freq: Once | ORAL | Status: AC
  Administered 2014-07-11: 40 mg via ORAL
  Filled 2014-07-11: qty 2

## 2014-07-11 MED ORDER — FENTANYL CITRATE 0.05 MG/ML IJ SOLN
INTRAMUSCULAR | Status: AC
  Filled 2014-07-11: qty 2

## 2014-07-11 MED ORDER — KETOROLAC TROMETHAMINE 60 MG/2ML IM SOLN
INTRAMUSCULAR | Status: DC | PRN
  Administered 2014-07-11: 30 mg via INTRAMUSCULAR

## 2014-07-11 MED ORDER — ONDANSETRON HCL 4 MG/2ML IJ SOLN
INTRAMUSCULAR | Status: AC
  Filled 2014-07-11: qty 2

## 2014-07-11 MED ORDER — SODIUM BICARBONATE 8.4 % IV SOLN
INTRAVENOUS | Status: AC
  Filled 2014-07-11: qty 50

## 2014-07-11 MED ORDER — KETOROLAC TROMETHAMINE 30 MG/ML IJ SOLN: 15.0000 mg | Freq: Once | INTRAMUSCULAR | Status: DC | PRN

## 2014-07-11 MED ORDER — HYDROMORPHONE HCL 1 MG/ML IJ SOLN
0.2500 mg | INTRAMUSCULAR | Status: DC | PRN
  Administered 2014-07-11 (×2): 0.5 mg via INTRAVENOUS

## 2014-07-11 MED ORDER — FAMOTIDINE 20 MG PO TABS: 40.0000 mg | ORAL_TABLET | Freq: Once | ORAL | Status: DC

## 2014-07-11 MED ORDER — HYDROMORPHONE HCL 1 MG/ML IJ SOLN
INTRAMUSCULAR | Status: AC
  Administered 2014-07-11: 0.5 mg via INTRAVENOUS
  Filled 2014-07-11: qty 1

## 2014-07-11 MED ORDER — FENTANYL CITRATE 0.05 MG/ML IJ SOLN
INTRAMUSCULAR | Status: DC | PRN
  Administered 2014-07-11 (×2): 50 ug via INTRAVENOUS

## 2014-07-11 MED ORDER — ONDANSETRON HCL 4 MG/2ML IJ SOLN
INTRAMUSCULAR | Status: DC | PRN
  Administered 2014-07-11: 4 mg via INTRAVENOUS

## 2014-07-11 MED ORDER — LACTATED RINGERS IV SOLN
INTRAVENOUS | Status: DC
  Administered 2014-07-11 (×2): via INTRAVENOUS

## 2014-07-11 MED ORDER — MIDAZOLAM HCL 5 MG/5ML IJ SOLN
INTRAMUSCULAR | Status: DC | PRN
  Administered 2014-07-11: 2 mg via INTRAVENOUS

## 2014-07-11 SURGICAL SUPPLY — 29 items
ADH SKN CLS APL DERMABOND .7 (GAUZE/BANDAGES/DRESSINGS) ×1
CLOTH BEACON ORANGE TIMEOUT ST (SAFETY) ×3 IMPLANT
CONTAINER PREFILL 10% NBF 15ML (MISCELLANEOUS) ×6 IMPLANT
DERMABOND ADVANCED (GAUZE/BANDAGES/DRESSINGS) ×2
DERMABOND ADVANCED .7 DNX12 (GAUZE/BANDAGES/DRESSINGS) ×1 IMPLANT
DRSG OPSITE POSTOP 3X4 (GAUZE/BANDAGES/DRESSINGS) ×3 IMPLANT
DURAPREP 26ML APPLICATOR (WOUND CARE) ×3 IMPLANT
ELECT REM PT RETURN 9FT ADLT (ELECTROSURGICAL) ×3
ELECTRODE REM PT RTRN 9FT ADLT (ELECTROSURGICAL) ×1 IMPLANT
GLOVE BIO SURGEON STRL SZ 6.5 (GLOVE) ×2 IMPLANT
GLOVE BIO SURGEONS STRL SZ 6.5 (GLOVE) ×1
GLOVE INDICATOR 7.0 STRL GRN (GLOVE) ×6 IMPLANT
GOWN STRL REUS W/TWL LRG LVL3 (GOWN DISPOSABLE) ×6 IMPLANT
NDL HYPO 25X1 1.5 SAFETY (NEEDLE) ×1 IMPLANT
NEEDLE HYPO 25X1 1.5 SAFETY (NEEDLE) ×3 IMPLANT
NS IRRIG 1000ML POUR BTL (IV SOLUTION) ×3 IMPLANT
PACK ABDOMINAL MINOR (CUSTOM PROCEDURE TRAY) ×3 IMPLANT
PENCIL BUTTON HOLSTER BLD 10FT (ELECTRODE) ×3 IMPLANT
SPONGE LAP 4X18 X RAY DECT (DISPOSABLE) ×3 IMPLANT
SUT MON AB 4-0 PS1 27 (SUTURE) ×3 IMPLANT
SUT PLAIN 0 NONE (SUTURE) ×5 IMPLANT
SUT VICRYL 0 UR6 27IN ABS (SUTURE) ×3 IMPLANT
SYR CONTROL 10ML LL (SYRINGE) ×3 IMPLANT
TOWEL OR 17X24 6PK STRL BLUE (TOWEL DISPOSABLE) ×6 IMPLANT
TRAY FOLEY CATH 14FR (SET/KITS/TRAYS/PACK) ×3 IMPLANT
TUBING NON-CON 1/4 X 20 CONN (TUBING) ×1 IMPLANT
TUBING NON-CON 1/4 X 20' CONN (TUBING) ×1
WATER STERILE IRR 1000ML POUR (IV SOLUTION) ×3 IMPLANT
YANKAUER SUCT BULB TIP NO VENT (SUCTIONS) ×2 IMPLANT

## 2014-07-11 NOTE — Transfer of Care (Signed)
Immediate Anesthesia Transfer of Care Note  Patient: Kelly Barrett  Procedure(s) Performed: Procedure(s): POST PARTUM TUBAL LIGATION (Bilateral)  Patient Location: PACU  Anesthesia Type:Epidural  Level of Consciousness: awake, alert  and oriented  Airway & Oxygen Therapy: Patient Spontanous Breathing  Post-op Assessment: Report given to PACU RN and Post -op Vital signs reviewed and stable  Post vital signs: Reviewed and stable  Complications: No apparent anesthesia complications

## 2014-07-11 NOTE — Anesthesia Postprocedure Evaluation (Signed)
  Anesthesia Post-op Note  Patient: Sheleen N Guida  Procedure(s) Performed: * No procedures listed *  Patient Location: Mother/Baby  Anesthesia Type:Epidural  Level of Consciousness: awake, oriented and patient cooperative  Airway and Oxygen Therapy: Patient Spontanous Breathing  Post-op Pain: none  Post-op Assessment: Post-op Vital signs reviewed, Patient's Cardiovascular Status Stable, Respiratory Function Stable, Patent Airway, No signs of Nausea or vomiting, Adequate PO intake, Pain level controlled, No headache, No backache, No residual numbness and No residual motor weakness  Post-op Vital Signs: Reviewed and stable  Last Vitals:  Filed Vitals:   07/10/14 2345  BP: 141/89  Pulse: 76  Temp: 36.7 C  Resp: 18    Complications: No apparent anesthesia complications

## 2014-07-11 NOTE — Op Note (Signed)
OPERATIVE NOTE  Maurine MinisterCharyss N Norment  DOB:    1981/01/27  MRN:    [id number]  CSN:    [id number]  Date of Surgery:  07/11/2014   Preoperative Diagnosis: Desired Sterilization  Postoperative Diagnosis: Same as above  Procedure:  Post partum tubal ligation via Barnett AbuPomeroy method  Surgeon:  Delila SpenceWalda S. Mora ApplPinn, M.D.  Assistant: None  Anesthetic: Local, Epidural    Fluids: 1400 mL UOP: 100 mL  Catheter: N/A EBL: 10 mL Complications: None  Indication:  Ms. Roselee Novaltman is a 33 y.o. year old female,who desires permanent sterilization. Risks, benefits, alternatives and indication was explained to patient prior to the procedure.   Findings:  After adequate level of epidural was determined for surgery the patient was prepped and draped in the usual sterile fashion.  A small subumbilical incision was performed along a previous scar using a 15 blade scalpel and the previous keloid scar removed. The fascia was entered sharply using Mayo scissors and the peritoneum identified and easily entered. The fundus of the uterus was easily located with the surgeon's fingers through the incision and the right fallopian tube was swept into view so that the midportion of the tube could be grasped with Babcock clamps and delivered. As the loop was held up, the fallopian tube was traced down to the fimbriated end to ensure that this was the fallopian tube. The midportion of the fallopian tube was held with the Babcock clamp and a window into the mesosalpinx was made with the bovie. The tube was ligated twice with two free tie sutures of 0-plain catgut. The loop was cut away with Metzenbaum scissors and hemostasis was performed using electrocautery.  The tubal segment was handed off to be sent to Pathology. The process was repeated on the opposite side, grasping the midportion of the left fallopian tube. However, the fimbriated end was adhered to the posterior portion of the uterus.  The round ligament could be visualized  through the incision anteriorly to the held structure and so the decision was made to proceed. The midportion again was held with a Babcock clamp, the mesosalpinx entered with bovie cautery and the tube ligated with 0-plain gut. The loop of tube was cut away with Metzenbaum scissors and the end cauterized with the bovie.  The fallopian tube was placed back into the peritoneum. The fascia was reapproximated with 2-0 vicryl. The skin was reapproximated with subcuticular stitch of 4-0 monocryl. The patient's incision was cleaned and dermabond was placed.   The patient tolerated the procedure well and was sent to the PACU in good condition. All instrument, sponge and needle counts were correct x 2  Brookie Wayment STACIA

## 2014-07-11 NOTE — Anesthesia Postprocedure Evaluation (Signed)
Anesthesia Post Note  Patient: Kelly Barrett  Procedure(s) Performed: Procedure(s) (LRB): POST PARTUM TUBAL LIGATION (Bilateral)  Anesthesia type: Epidural  Patient location: PACU  Post pain: Pain level controlled  Post assessment: Post-op Vital signs reviewed  Last Vitals:  Filed Vitals:   07/11/14 1135  BP: 142/90  Pulse: 63  Temp: 37 C  Resp: 18    Post vital signs: Reviewed  Level of consciousness: awake  Complications: No apparent anesthesia complications

## 2014-07-11 NOTE — Progress Notes (Signed)
IV charted incorrectly.  Patient has 18 gauge in right hand not 16 gauge in left hand.   Joesph Julyoletta Aliveah Gallant, RN

## 2014-07-11 NOTE — Lactation Note (Signed)
This note was copied from the chart of Kelly Barrett. Lactation Consultation Note  Patient Name: Kelly Barrett Reason for consult: Follow-up assessment of this 37 week baby weighing less than 6 pounds and now 30 hours of age.  Mom had tubal today and has been feeding bottles with formula. Mom breastfed twice last night but has only bottle-fed with formula since the midnight feeding and informs LC that she has decided to formula feed only.  [facility] if [facility] decides to resume breastfeeding.   Maternal Data Formula Feeding for Exclusion: Yes Reason for exclusion: Mother's choice to formula and breast feed on admission (mom states she has decided to formula feed only)  Feeding Feeding Type: Bottle Fed - Formula Nipple Type: Slow - flow  LATCH Score/Interventions           no breastfeeding since midnight           Lactation Tools Discussed/Used   N/A - mom no longer breastfeeding  Consult Status Consult Status: PRN Follow-up type: Call as needed    Lynda RainwaterBryant, Zaylon Bossier [name], 9:04 PM

## 2014-07-11 NOTE — Anesthesia Preprocedure Evaluation (Signed)
Anesthesia Evaluation  Patient identified by MRN, date of birth, ID band Patient awake    Reviewed: Allergy & Precautions, H&P , NPO status , Patient's Chart, lab work & pertinent test results, reviewed documented beta blocker date and time   History of Anesthesia Complications Negative for: history of anesthetic complications  Airway Mallampati: I TM Distance: >3 FB Neck ROM: full    Dental  (+) Teeth Intact   Pulmonary neg pulmonary ROS,  breath sounds clear to auscultation        Cardiovascular hypertension (GHTN), Rhythm:regular Rate:Normal     Neuro/Psych negative psych ROS   GI/Hepatic negative GI ROS, Neg liver ROS,   Endo/Other  negative endocrine ROS  Renal/GU negative Renal ROS     Musculoskeletal  (+) Fibromyalgia -  Abdominal   Peds  Hematology  (+) anemia ,   Anesthesia Other Findings   Reproductive/Obstetrics (+) Pregnancy (plans PPBTL)                           Anesthesia Physical  Anesthesia Plan  ASA: II  Anesthesia Plan: Epidural   Post-op Pain Management:    Induction:   Airway Management Planned:   Additional Equipment:   Intra-op Plan:   Post-operative Plan:   Informed Consent: I have reviewed the patients History and Physical, chart, labs and discussed the procedure including the risks, benefits and alternatives for the proposed anesthesia with the patient or authorized representative who has indicated his/her understanding and acceptance.     Plan Discussed with:   Anesthesia Plan Comments: (Epidural in-situ for PPTL.)        Anesthesia Quick Evaluation

## 2014-07-11 NOTE — Progress Notes (Signed)
Post Partum Day 1 Subjective: no complaints, up ad lib, voiding, tolerating PO and + flatus  Objective: Blood pressure 141/89, pulse 76, temperature 98 F (36.7 C), temperature source Oral, resp. rate 18, height 5\' 3"  (1.6 m), weight 72.122 kg (159 lb), last menstrual period 11/07/2013, unknown if currently breastfeeding.  Physical Exam:  General: alert, cooperative and no distress Lochia: appropriate Uterine Fundus: firm DVT Evaluation: No evidence of DVT seen on physical exam. Negative Homan's sign. No cords or calf tenderness.   Recent Labs  07/10/14 0714 07/11/14 0550  HGB 9.2* 9.2*  HCT 29.3* 29.3*    Assessment/Plan: Plan for discharge tomorrow, Breastfeeding Patient scheduled for post partum tubal this afternoon   LOS: 1 day   Rhayne Chatwin STACIA 07/11/2014, 9:09 AM

## 2014-07-12 ENCOUNTER — Encounter (HOSPITAL_COMMUNITY): Payer: Self-pay | Admitting: Obstetrics & Gynecology

## 2014-07-12 MED ORDER — OXYCODONE-ACETAMINOPHEN 5-325 MG PO TABS: 2.0000 | ORAL_TABLET | ORAL | Status: DC | PRN

## 2014-07-12 NOTE — Discharge Summary (Signed)
Obstetric Discharge Summary Reason for Admission: unsure - H&P not complete Prenatal Procedures: ultrasound Intrapartum Procedures: spontaneous vaginal delivery Postpartum Procedures: none Complications-Operative and Postpartum: none Hemoglobin  Date Value Ref Range Status  07/11/2014 9.2* 12.0 - 15.0 g/dL Final  5/4/09816/03/2012 19.110.7* 47.812.2 - 16.2 g/dL Final     HCT  Date Value Ref Range Status  07/11/2014 29.3* 36.0 - 46.0 % Final     HCT, POC  Date Value Ref Range Status  03/03/2012 35.6* 37.7 - 47.9 % Final    Physical Exam:  General: alert and cooperative Lochia: appropriate Uterine Fundus: firm Inc: c/d/i DVT Evaluation: No evidence of DVT seen on physical exam.  Discharge Diagnoses: Term Pregnancy-delivered  Discharge Information: Date: 07/12/2014 Activity: pelvic rest Diet: routine Medications: PNV, Ibuprofen and Percocet Condition: stable Instructions: refer to practice specific booklet Discharge to: home Follow-up Information   Follow up with Almon HerculesOSS,KENDRA H., MD In 1 week. (BP check)    Specialty:  Obstetrics and Gynecology   Contact information:   [address] [place] [address] [phone]       Newborn Data: Live born female  Birth Weight: 5 lb 13.5 oz (2651 g) APGAR: 9, 9  Home with mother.  Kelly Barrett, Kelly Barrett 07/12/2014, 10:26 AM

## 2014-07-12 NOTE — Anesthesia Postprocedure Evaluation (Signed)
  Anesthesia Post-op Note  Anesthesia Post Note  Patient: Kelly Barrett  Procedure(s) Performed: Procedure(s) (LRB): POST PARTUM TUBAL LIGATION (Bilateral)  Anesthesia type: Epidural  Patient location: Mother/Baby  Post pain: Pain level controlled  Post assessment: Post-op Vital signs reviewed  Last Vitals:  Filed Vitals:   07/12/14 0630  BP: 155/86  Pulse: 65  Temp: 36.8 C  Resp: 18    Post vital signs: Reviewed  Level of consciousness:alert  Complications: No apparent anesthesia complications

## 2014-07-12 NOTE — Progress Notes (Signed)
Patient is eating, ambulating, voiding.  Pain control is good.  Appropriate lochia.  Some umbilical incision tenderness, otherwise no complaints.   Filed Vitals:   07/11/14 1709 07/11/14 2100 07/12/14 0100 07/12/14 0630  BP: 137/81 138/89 146/90 155/86  Pulse: 66 72 59 65  Temp:  98.1 F (36.7 C) 97.7 F (36.5 C) 98.2 F (36.8 C)  TempSrc:  Oral Oral Oral  Resp: 18 18 18 18   Height:      Weight:      SpO2:        Fundus firm Perineum without swelling. Inc: c/d/i Ext: no CT  Lab Results  Component Value Date   WBC 10.0 07/11/2014   HGB 9.2* 07/11/2014   HCT 29.3* 07/11/2014   MCV 74.7* 07/11/2014   PLT 336 07/11/2014    --/--/A POS (10/14 82950714)  A/P Post partum day #2, post op day #1 s/p PPTL.  Routine care.  Expect d/c today.    Kelly Barrett, Kelly Barrett

## 2014-07-12 NOTE — Progress Notes (Signed)
Patient declines tdap vaccine

## 2014-07-12 NOTE — Addendum Note (Signed)
Addendum created 07/12/14 [id number] by Turner DanielsJennifer L Shereece Wellborn, CRNA   Modules edited: Notes Section   Notes Section:  File: [id number]

## 2014-07-29 ENCOUNTER — Encounter (HOSPITAL_COMMUNITY): Payer: Self-pay | Admitting: Obstetrics & Gynecology

## 2014-08-28 NOTE — H&P (Signed)
Kelly Barrett is a 33 y.o. female presenting for IOL for gestational hypertension  33 yo G2P1001 @ 37+0 weeks admitted for IOL for gestational hypertension. Pt initially diagnosed with gestational hypertension @ 35 weeks. S/P antenatal admission. Otherwise her pregnancy has been uncomplicated except for anemia History OB History as of 07/25/14    Gravida Para Term Preterm AB TAB SAB Ectopic Multiple Living   2 2 2       2      Past Medical History  Diagnosis Date  . Allergy   . Fibromyalgia     DX back in HS 2002-2003  . Pyloric stenosis     repaired as infant  . History of HPV infection   . Anemia    Past Surgical History  Procedure Laterality Date  . Cholecystectomy    . Tubal ligation Bilateral 07/11/2014    Procedure: POST PARTUM TUBAL LIGATION;  Surgeon: Essie HartWalda Pinn, MD;  Location: [facility];  Service: Gynecology;  Laterality: Bilateral;   Family History: family history includes Allergies in her son; Asthma in her son; Diabetes in her father; Hypertension in her mother and sister. Social History:  reports that she has never smoked. She has never used smokeless tobacco. She reports that she does not drink alcohol or use illicit drugs.   Prenatal Transfer Tool  Maternal Diabetes: No Genetic Screening: NIPT <1:10000 V78,46,96T13,18,21 FF%9.2 female gender Maternal Ultrasounds/Referrals: Normal Fetal Ultrasounds or other Referrals:  Normal Maternal Substance Abuse:  Denies Significant Maternal Medications:  None Significant Maternal Lab Results:  None Other Comments:  None  ROS: as above  Dilation: 10 Effacement (%): 50 Station: +3 Exam by:: m wilkins rnc Blood pressure 134/97, pulse 65, temperature 99.4 F (37.4 C), temperature source Oral, resp. rate 18, height 5\' 3"  (1.6 m), weight 72.122 kg (159 lb), last menstrual period 11/07/2013, SpO2 100 %, unknown if currently breastfeeding. Exam Physical Exam  Prenatal labs: ABO, Rh: --/--/A POS (10/14 [id number]) Antibody: NEG (10/14  0714) Rubella: Immune (04/16 0000) RPR: NON REAC (10/14 0714)  HBsAg: Negative (04/16 0000)  HIV: Non-reactive (04/16 0000)  GBS:   Positive  Assessment/Plan: 1) Admit 2) PCN for GBS 3) Epidural on request 4) AROM when able   Kelly Barrett H. 08/28/2014, 9:20 AM

## 2014-12-23 ENCOUNTER — Ambulatory Visit (INDEPENDENT_AMBULATORY_CARE_PROVIDER_SITE_OTHER): Payer: 59 | Admitting: Internal Medicine

## 2014-12-23 VITALS — BP 138/93 | HR 88 | Temp 98.7°F | Resp 24 | Ht 63.0 in | Wt 142.5 lb

## 2014-12-23 DIAGNOSIS — R112 Nausea with vomiting, unspecified: Secondary | ICD-10-CM | POA: Diagnosis not present

## 2014-12-23 MED ORDER — ONDANSETRON HCL 4 MG PO TABS: 4.0000 mg | ORAL_TABLET | Freq: Three times a day (TID) | ORAL | Status: DC | PRN

## 2014-12-23 MED ORDER — ONDANSETRON 4 MG PO TBDP
8.0000 mg | ORAL_TABLET | Freq: Once | ORAL | Status: AC
  Administered 2014-12-23: 8 mg via ORAL

## 2014-12-23 NOTE — Progress Notes (Signed)
This chart was scribed for Kelly Sia, MD by Kelly Barrett, Medical Scribe. This patient was seen in room 11 and the patient's care was started at 7:55 PM.  Subjective:    Patient ID: Kelly Barrett, female    DOB: [birth year], 34 y.o.   MRN: [id number]  Chief Complaint  Patient presents with  . Emesis  . Chills  . Generalized Body Aches  . Diarrhea    yesterday but none today    HPI Kelly Barrett is a 34 y.o. female who presents to the office with multiple complaints. She is complaining of several episodes of emesis, chills, generalized myalgias, and diarrhea that all started yesterday.  No diarrhea endorsed today, however pt has not been able to tolerate solids. Pt does endorse a subjective fever with unknown TMAX. Current office temperature is 98.7. Pt does endorse multiple sick contacts at home with similar symptoms. She denies neck pain, sore throat, visual disturbance, CP, cough, SOB, urinary symptoms, back pain, HA, weakness, numbness and rash as associated symptoms.    Patient Active Problem List   Diagnosis Date Noted  . Gestational hypertension w/o significant proteinuria in 3rd trimester 07/10/2014  . Spontaneous vaginal delivery 07/10/2014  . HTN complicating peripregnancy, antepartum 06/26/2014   Past Medical History  Diagnosis Date  . Allergy   . Fibromyalgia     DX back in HS 2002-2003  . Pyloric stenosis     repaired as infant  . History of HPV infection   . Anemia   . Depression   . Hypertension    Past Surgical History  Procedure Laterality Date  . Cholecystectomy    . Tubal ligation Bilateral 07/11/2014    Procedure: POST PARTUM TUBAL LIGATION;  Surgeon: Essie Hart, MD;  Location: [facility];  Service: Gynecology;  Laterality: Bilateral;   No Known Allergies Prior to Admission medications   Not on File   History   Social History  . Marital Status: Single    Spouse Name: N/A  . Number of Children: N/A  . Years of Education: N/A    Occupational History  . Not on file.   Social History Main Topics  . Smoking status: Never Smoker   . Smokeless tobacco: Never Used  . Alcohol Use: No  . Drug Use: No  . Sexual Activity:    Partners: Male    Birth Control/ Protection: Other-see comments     Comment: NuvaRing   Other Topics Concern  . Not on file   Social History Narrative   Lives with her son.     Review of Systems  Constitutional: Positive for fever and chills.  HENT: Negative for congestion and sore throat.   Respiratory: Negative for cough and shortness of breath.   Cardiovascular: Negative for chest pain.  Gastrointestinal: Positive for nausea, vomiting and diarrhea. Negative for abdominal pain.  Genitourinary: Negative for difficulty urinating.  Skin: Negative for rash.  Neurological: Negative for weakness and numbness.      BP 138/93 mmHg  Pulse 88  Temp(Src) 98.7 F (37.1 C) (Oral)  Resp 24  Ht  (1.6 m)  Wt 142 lb 8 oz (64.638 kg)  BMI 25.25 kg/m2  SpO2 100%  LMP 12/11/2014  Breastfeeding? No  Objective:   Physical Exam  Constitutional: She appears well-developed and well-nourished. No distress.  HENT:  Head: Normocephalic and atraumatic.  Right Ear: External ear normal.  Left Ear: External ear normal.  Nose: Nose normal.  Mouth/Throat: Oropharynx is clear and  moist. No oropharyngeal exudate.  Eyes: Conjunctivae and EOM are normal. Pupils are equal, round, and reactive to light. Right eye exhibits no discharge. Left eye exhibits no discharge.  Neck: Normal range of motion. Neck supple. No thyromegaly present.  Cardiovascular: Normal rate, regular rhythm and normal heart sounds.  Exam reveals no gallop and no friction rub.   No murmur heard. Pulmonary/Chest: Effort normal and breath sounds normal. No respiratory distress. She has no wheezes. She has no rales.  Abdominal: Soft. Bowel sounds are normal. She exhibits no distension and no mass. There is no tenderness. There is no  rebound and no guarding.  Musculoskeletal: She exhibits no edema or tenderness.  Lymphadenopathy:    She has no cervical adenopathy.  Neurological: She is alert.  Skin: Skin is warm and dry.  Psychiatric: She has a normal mood and affect. Her behavior is normal. Thought content normal.  Nursing note and vitals reviewed.   Assessment & Plan:  Non-intractable vomiting with nausea, vomiting of unspecified type - Plan: ondansetron (ZOFRAN-ODT) disintegrating tablet 8 mg  suspect viral gastroenteritis Meds ordered this encounter  Medications  . ondansetron (ZOFRAN) 4 MG tablet    Sig: Take 1 tablet (4 mg total) by mouth every 8 (eight) hours as needed for nausea or vomiting.    Dispense:  10 tablet    Refill:  0  . ondansetron (ZOFRAN-ODT) disintegrating tablet 8 mg    Sig:   . escitalopram (LEXAPRO) 20 MG tablet    Sig:     Refill:  6    Clear fluids  1-fourth glass every 15 minutes for 2 hours and advance as tolerated   I have completed the patient encounter in its entirety as documented by the scribe, with editing by me where necessary. Kelly Barrett P. Kelly Barrett, M.D.

## 2015-10-10 ENCOUNTER — Ambulatory Visit (INDEPENDENT_AMBULATORY_CARE_PROVIDER_SITE_OTHER): Payer: 59 | Admitting: Internal Medicine

## 2015-10-10 VITALS — BP 160/98 | HR 66 | Temp 98.3°F | Resp 16 | Ht 63.0 in | Wt 157.0 lb

## 2015-10-10 DIAGNOSIS — G4700 Insomnia, unspecified: Secondary | ICD-10-CM | POA: Diagnosis not present

## 2015-10-10 DIAGNOSIS — F4329 Adjustment disorder with other symptoms: Secondary | ICD-10-CM

## 2015-10-10 DIAGNOSIS — R5383 Other fatigue: Secondary | ICD-10-CM

## 2015-10-10 DIAGNOSIS — D509 Iron deficiency anemia, unspecified: Secondary | ICD-10-CM

## 2015-10-10 DIAGNOSIS — I10 Essential (primary) hypertension: Secondary | ICD-10-CM | POA: Diagnosis not present

## 2015-10-10 DIAGNOSIS — R0789 Other chest pain: Secondary | ICD-10-CM

## 2015-10-10 LAB — POCT CBC
Granulocyte percent: 57 %G (ref 37–80)
HEMATOCRIT: 29.6 % — AB (ref 37.7–47.9)
Hemoglobin: 9.2 g/dL — AB (ref 12.2–16.2)
LYMPH, POC: 2.5 (ref 0.6–3.4)
MCH, POC: 21.2 pg — AB (ref 27–31.2)
MCHC: 31 g/dL — AB (ref 31.8–35.4)
MCV: 68.3 fL — AB (ref 80–97)
MID (cbc): 0.3 (ref 0–0.9)
MPV: 6.9 fL (ref 0–99.8)
POC Granulocyte: 3.8 (ref 2–6.9)
POC LYMPH PERCENT: 38 %L (ref 10–50)
POC MID %: 5 % (ref 0–12)
Platelet Count, POC: 472 10*3/uL — AB (ref 142–424)
RBC: 4.33 M/uL (ref 4.04–5.48)
RDW, POC: 17.4 %
WBC: 6.7 10*3/uL (ref 4.6–10.2)

## 2015-10-10 LAB — POCT URINALYSIS DIP (MANUAL ENTRY)
Bilirubin, UA: NEGATIVE
Blood, UA: NEGATIVE
Glucose, UA: NEGATIVE
Ketones, POC UA: NEGATIVE
Nitrite, UA: NEGATIVE
PROTEIN UA: NEGATIVE
Spec Grav, UA: 1.015
UROBILINOGEN UA: 0.2
pH, UA: 7

## 2015-10-10 LAB — LIPID PANEL
Cholesterol: 135 mg/dL (ref 125–200)
HDL: 42 mg/dL — ABNORMAL LOW (ref >=46)
LDL Cholesterol: 82 mg/dL (ref <130)
Total CHOL/HDL Ratio: 3.2 Ratio (ref <=5.0)
Triglycerides: 54 mg/dL (ref <150)
VLDL: 11 mg/dL (ref <30)

## 2015-10-10 LAB — COMPREHENSIVE METABOLIC PANEL
ALT: 10 U/L (ref 6–29)
AST: 12 U/L (ref 10–30)
Albumin: 4.1 g/dL (ref 3.6–5.1)
Alkaline Phosphatase: 89 U/L (ref 33–115)
BILIRUBIN TOTAL: 0.4 mg/dL (ref 0.2–1.2)
BUN: 8 mg/dL (ref 7–25)
CO2: 27 mmol/L (ref 20–31)
CREATININE: 0.74 mg/dL (ref 0.50–1.10)
Calcium: 9.6 mg/dL (ref 8.6–10.2)
Chloride: 103 mmol/L (ref 98–110)
Glucose, Bld: 72 mg/dL (ref 65–99)
Potassium: 4.3 mmol/L (ref 3.5–5.3)
Sodium: 139 mmol/L (ref 135–146)
TOTAL PROTEIN: 6.8 g/dL (ref 6.1–8.1)

## 2015-10-10 MED ORDER — AMLODIPINE BESYLATE 10 MG PO TABS: 10.0000 mg | ORAL_TABLET | Freq: Every day | ORAL | Status: DC

## 2015-10-10 MED ORDER — TEMAZEPAM 15 MG PO CAPS: 15.0000 mg | ORAL_CAPSULE | Freq: Every evening | ORAL | Status: DC | PRN

## 2015-10-10 NOTE — Patient Instructions (Signed)
Ferrous sulfate 325 mg daily (or twice daily if tolerated)

## 2015-10-10 NOTE — Progress Notes (Addendum)
Subjective:    Patient ID: Kelly Barrett, female    DOB: 04/04/1981, 35 y.o.   MRN: [id number] By signing my name below, I, Kelly Barrett, attest that this documentation has been prepared under the direction and in the presence of Kelly Sia, MD. Electronically Signed: Javier Barrett, ER Scribe. 10/10/2015. 2:56 PM.  Chief Complaint  Patient presents with  . Headache    pt states she also has blurred vision, that comes and goes  . Chest Pain    x today   HPI HPI Comments: Kelly Barrett is a 35 y.o. female who presents to [facility] complaining of blurred vision and chest heaviness since work earlier today. She denies GERD sx. She states it feels like her heart is beating too fast. She started having chest heaviness at work earlier today while she was sitting at her desk. She is also complaining of double vision since the onset of her chest heaviness. She states her vision has been hazy since she had her daughter 62 mos ago. 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 She has continued to have high blood pressure since the pregnancy. She has not exercised in several months. LNMP was the 15th of last month. She has had her tubes tied. She has a family hx of HTN in both mother and sister.  She denies recent kidney infections, night sweats, nausea, vomiting, swelling or numbness in extremities. She states she is having daytime fatigue and difficulty falling asleep at night. Her fatigue is extreme, and she states she sometimes falls asleep as soon as she comes home from work. She also wakes up in the middle of the night worrying about work and her children. She has two children at home and she does not get any help from their father. Work is extremely stressful and it is unlikely to get better. 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  Parents live here and help out  Went to work out of [employer]  Past Medical History  Diagnosis Date  .  Allergy   . Fibromyalgia     DX back in HS 2002-2003  . Pyloric stenosis     repaired as infant  . History of HPV infection   . Anemia   . Depression   . Hypertension    Current Outpatient Prescriptions on File Prior to Visit--none  Was sent home postpartum on HCTZ but didn't f/u   Review of Systems  Constitutional: Positive for fatigue. Negative for fever, chills, diaphoresis, activity change, appetite change and unexpected weight change.  Eyes: Positive for visual disturbance.  Respiratory: Positive for chest tightness. Negative for cough, choking, shortness of breath and wheezing.   Cardiovascular: Positive for palpitations. Negative for chest pain and leg swelling.  Gastrointestinal: Negative for nausea, vomiting and abdominal pain.  Genitourinary: Negative for difficulty urinating.  Neurological: Positive for headaches. Negative for weakness and numbness.  Psychiatric/Behavioral: Positive for sleep disturbance. Negative for dysphoric mood.      Objective:  BP 160/98 mmHg  Pulse 66  Temp(Src) 98.3 F (36.8 C) (Oral)  Resp 16  Ht 5\' 3"  (1.6 m)  Wt 157 lb (71.215 kg)  BMI 27.82 kg/m2  SpO2 99%  LMP 09/11/2015  Physical Exam  Constitutional: She is oriented to person, place, and time. She appears well-developed and well-nourished. No distress.  HENT:  Head: Normocephalic and atraumatic.  Right Ear: External ear normal.  Left Ear: External ear  normal.  Nose: Nose normal.  Mouth/Throat: Oropharynx is clear and moist.  Eyes: Conjunctivae and EOM are normal. Pupils are equal, round, and reactive to light.  Neck: Normal range of motion. Neck supple. No thyromegaly present.  Cardiovascular: Normal rate, regular rhythm, normal heart sounds and intact distal pulses.   No murmur heard. Pulmonary/Chest: Effort normal and breath sounds normal. No respiratory distress. She has no wheezes.  Abdominal: She exhibits no distension and no mass. There is no tenderness.    Musculoskeletal: Normal range of motion. She exhibits no edema.  Lymphadenopathy:    She has no cervical adenopathy.  Neurological: She is alert and oriented to person, place, and time. She has normal reflexes. No cranial nerve deficit. Coordination normal.  Skin: Skin is warm and dry. She is not diaphoretic.  Psychiatric: She has a normal mood and affect. Her behavior is normal. Thought content normal.  Nursing note and vitals reviewed.  Results for orders placed or performed in visit on 10/10/15  POCT CBC  Result Value Ref Range   WBC 6.7 4.6 - 10.2 K/uL   Lymph, poc 2.5 0.6 - 3.4   POC LYMPH PERCENT 38.0 10 - 50 %L   MID (cbc) 0.3 0 - 0.9   POC MID % 5.0 0 - 12 %M   POC Granulocyte 3.8 2 - 6.9   Granulocyte percent 57.0 37 - 80 %G   RBC 4.33 4.04 - 5.48 M/uL   Hemoglobin 9.2 (A)---same as post delivery--known to be Hgb AA 12.2 - 16.2 g/dL   HCT, POC 16.129.6 (A) 09.637.7 - 47.9 %   MCV 68.3 (A) 80 - 97 fL   MCH, POC 21.2 (A) 27 - 31.2 pg   MCHC 31.0 (A) 31.8 - 35.4 g/dL   RDW, POC 04.517.4 %   Platelet Count, POC 472 (A) 142 - 424 K/uL   MPV 6.9 0 - 99.8 fL  POCT urinalysis dipstick  Result Value Ref Range   Color, UA yellow yellow   Clarity, UA clear clear   Glucose, UA negative negative   Bilirubin, UA negative negative   Ketones, POC UA negative negative   Spec Grav, UA 1.015    Blood, UA negative negative   pH, UA 7.0    Protein Ur, POC negative negative   Urobilinogen, UA 0.2    Nitrite, UA Negative Negative   Leukocytes, UA Trace (A) Negative     ekg wnl Assessment & Plan:  Essential hypertension - Plan: POCT CBC, Comprehensive metabolic panel, Lipid panel, POCT urinalysis dipstick, EKG 12-Lead  Other fatigue - Plan: TSH  Insomnia  Other chest pain - Plan: EKG 12-Lead  Stress and adjustment reaction  Iron deficiency anemia  Meds ordered this encounter  Medications  . amLODipine (NORVASC) 10 MG tablet    Sig: Take 1 tablet (10 mg total) by mouth daily.     Dispense:  90 tablet    Refill:  3  . temazepam (RESTORIL) 15 MG capsule    Sig: Take 1 capsule (15 mg total) by mouth at bedtime as needed for sleep.    Dispense:  30 capsule    Refill:  1   Disc counseling Disc alt careers Disc other suopport for sing moms Home BPs F/u 1 mo DGessner  I have completed the patient encounter in its entirety as documented by the scribe, with editing by me where necessary. Orel Hord P. Merla Richesoolittle, M.D.

## 2015-10-11 LAB — TSH: TSH: 0.728 u[IU]/mL (ref 0.350–4.500)

## 2015-11-13 ENCOUNTER — Ambulatory Visit: Payer: 59 | Admitting: Family Medicine

## 2016-07-21 ENCOUNTER — Other Ambulatory Visit: Payer: Self-pay | Admitting: Obstetrics and Gynecology

## 2016-07-22 LAB — CYTOLOGY - PAP

## 2016-10-05 DIAGNOSIS — J339 Nasal polyp, unspecified: Secondary | ICD-10-CM | POA: Diagnosis not present

## 2016-10-05 DIAGNOSIS — J069 Acute upper respiratory infection, unspecified: Secondary | ICD-10-CM | POA: Diagnosis not present

## 2017-12-05 ENCOUNTER — Other Ambulatory Visit: Payer: Self-pay

## 2017-12-05 ENCOUNTER — Ambulatory Visit: Payer: 59 | Admitting: Physician Assistant

## 2017-12-05 ENCOUNTER — Encounter: Payer: Self-pay | Admitting: Physician Assistant

## 2017-12-05 ENCOUNTER — Telehealth: Payer: Self-pay | Admitting: Physician Assistant

## 2017-12-05 VITALS — BP 151/94 | HR 90 | Temp 98.6°F | Resp 16 | Ht 63.0 in | Wt 149.0 lb

## 2017-12-05 DIAGNOSIS — R69 Illness, unspecified: Secondary | ICD-10-CM | POA: Diagnosis not present

## 2017-12-05 DIAGNOSIS — J111 Influenza due to unidentified influenza virus with other respiratory manifestations: Secondary | ICD-10-CM

## 2017-12-05 DIAGNOSIS — R030 Elevated blood-pressure reading, without diagnosis of hypertension: Secondary | ICD-10-CM

## 2017-12-05 NOTE — Progress Notes (Signed)
12/05/2017 12:48 PM   DOB: 1981-08-24 / MRN: [id number]  SUBJECTIVE:  Kelly Barrett is a 37 y.o. female presenting for resolving body aches, cough, headache, chills.  She feels that she is getting better at this time.  She tells me "I think I have the flu."  She denies history of asthma.  She has not had the flu shot.  Per HPI  She has No Known Allergies.   She  has a past medical history of Allergy, Anemia, Depression, Fibromyalgia, History of HPV infection, Hypertension, and Pyloric stenosis.    She  reports that  has never smoked. she has never used smokeless tobacco. She reports that she does not drink alcohol or use drugs. She  reports that she currently engages in sexual activity and has had partners who are Female. She reports using the following method of birth control/protection: Other-see comments. The patient  has a past surgical history that includes Cholecystectomy and Tubal ligation (Bilateral, 07/11/2014).  Her family history includes Allergies in her son; Asthma in her son; Diabetes in her father and mother; Hypertension in her mother and sister.  Review of Systems  Constitutional: Negative for fever.  HENT: Negative for sore throat.   Respiratory: Negative for shortness of breath.   Cardiovascular: Negative for chest pain and leg swelling.  Skin: Negative for itching and rash.  Neurological: Negative for dizziness and loss of consciousness.      The problem list and medications were reviewed and updated by myself where necessary and exist elsewhere in the encounter.   OBJECTIVE:  BP (!) 151/94   Pulse 90   Temp 98.6 F (37 C) (Oral)   Resp 16   Ht 5\' 3"  (1.6 m)   Wt 149 lb (67.6 kg)   LMP 12/04/2017   SpO2 98%   BMI 26.39 kg/m   Physical Exam  Constitutional: She is active.  Non-toxic appearance.  Cardiovascular: Normal rate, regular rhythm, S1 normal, S2 normal, normal heart sounds and intact distal pulses. Exam reveals no gallop, no friction rub and  no decreased pulses.  No murmur heard. Pulmonary/Chest: Effort normal. No stridor. No tachypnea. No respiratory distress. She has no wheezes. She has no rales.  Abdominal: She exhibits no distension.  Musculoskeletal: She exhibits no edema.  Neurological: She is alert.  Skin: Skin is warm and dry. She is not diaphoretic. No pallor.   BP Readings from Last 3 Encounters:  12/05/17 (!) 151/94  10/10/15 (!) 160/98  12/23/14 (!) 138/93     No results found for this or any previous visit (from the past 72 hour(s)).  No results found.  ASSESSMENT AND PLAN:  Keighley was seen today for cough, generalized body aches, chills and diarrhea.  Diagnoses and all orders for this visit:  Influenza-like illness patient with resolving symptoms at this time.  Canceled her flu [facility] however this test was in fact positive for flu A.  I have given her some instructions on the AVS with regard to symptom management.  Blood pressure is historically elevated.  Scheduling will be calling her to set up an appointment in the time that she feels well.  Elevated BP without diagnosis of hypertension  Other orders -     Cancel: POCT Influenza A/B    The patient is advised to call or return to clinic if she does not see an improvement in symptoms, or to seek the care of the closest emergency department if she  worsens with the above plan.   Deliah Boston, MHS, PA-C Primary Care at [facility] 12/05/2017 12:48 PM

## 2017-12-05 NOTE — Patient Instructions (Addendum)
  Please take 600 mg of ibuprofen every 6-8 hours as needed for body aches and fatigue.  You can continue Delsym, honey, Mucinex or a combination of those 3 for cough.  If your nose is getting stuffy and too bothersome then I would suggest taking some pseudoephedrine with Zyrtec or Zyrtec-MD 5/120.   IF you received an x-ray today, you will receive an invoice from [facility]. Please contact [facility] at [phone] with questions or concerns regarding your invoice.   IF you received labwork today, you will receive an invoice from [facility]. Please contact [facility] at [phone] with questions or concerns regarding your invoice.   Our billing staff will not be able to assist you with questions regarding bills from these companies.  You will be contacted with the lab results as soon as they are available. The fastest way to get your results is to activate your My Chart account. Instructions are located on the last page of this paperwork. If you have not heard from us regarding the results in 2 weeks, please contact this office.

## 2017-12-05 NOTE — Telephone Encounter (Signed)
Called pt and made an appt for 2 weeks out for an OV with Clark for her hypertension.   Thanks!

## 2017-12-05 NOTE — Telephone Encounter (Signed)
-----   [facility] sent at 12/05/2017 12:51 PM EDT -----   ----- Message ----- From: Ofilia Neaslark, Michael L, PA-C Sent: 12/05/2017  12:47 PM To: Pcp Scheduling Pool  Please advised that I would like to see her back for her history of hypertension.  I like to see her in the time when she is feeling well may be in the next few weeks.  Please make that appointment for her.

## 2017-12-23 ENCOUNTER — Other Ambulatory Visit: Payer: Self-pay

## 2017-12-23 ENCOUNTER — Encounter: Payer: Self-pay | Admitting: Physician Assistant

## 2017-12-23 ENCOUNTER — Ambulatory Visit: Payer: 59 | Admitting: Physician Assistant

## 2017-12-23 VITALS — BP 150/90 | HR 86 | Temp 98.6°F | Ht 63.0 in | Wt 152.4 lb

## 2017-12-23 DIAGNOSIS — I10 Essential (primary) hypertension: Secondary | ICD-10-CM | POA: Diagnosis not present

## 2017-12-23 DIAGNOSIS — R718 Other abnormality of red blood cells: Secondary | ICD-10-CM

## 2017-12-23 MED ORDER — AMLODIPINE BESYLATE 5 MG PO TABS: 5.0000 mg | ORAL_TABLET | Freq: Every day | ORAL | 3 refills | Status: DC

## 2017-12-23 NOTE — Patient Instructions (Signed)
     IF you received an x-ray today, you will receive an invoice from [facility]. Please contact [facility] at [phone] with questions or concerns regarding your invoice.   IF you received labwork today, you will receive an invoice from [facility]. Please contact [facility] at [phone] with questions or concerns regarding your invoice.   Our billing staff will not be able to assist you with questions regarding bills from these companies.  You will be contacted with the lab results as soon as they are available. The fastest way to get your results is to activate your My Chart account. Instructions are located on the last page of this paperwork. If you have not heard from us regarding the results in 2 weeks, please contact this office.     

## 2017-12-23 NOTE — Progress Notes (Signed)
12/23/2017 6:02 PM   DOB: 1981/05/01 / MRN: [id number]  SUBJECTIVE:  Kelly Barrett is a 37 y.o. female presenting for elevated blood pressure.  She has a history of this.  She denies acute chest pain, shortness of breath, DOE, leg swelling.  She has no history of diabetes or dyslipidemia.  She is a never smoker.  She has No Known Allergies.   She  has a past medical history of Allergy, Anemia, Depression, Fibromyalgia, History of HPV infection, Hypertension, and Pyloric stenosis.    She  reports that she has never smoked. She has never used smokeless tobacco. She reports that she does not drink alcohol or use drugs. She  reports that she currently engages in sexual activity and has had partners who are Female. She reports using the following method of birth control/protection: Other-see comments. The patient  has a past surgical history that includes Cholecystectomy and Tubal ligation (Bilateral, 07/11/2014).  Her family history includes Allergies in her son; Asthma in her son; Diabetes in her father and mother; Hypertension in her mother and sister.  Review of Systems  Constitutional: Negative for chills, diaphoresis and fever.  Eyes: Negative.   Respiratory: Negative for cough, hemoptysis, sputum production, shortness of breath and wheezing.   Cardiovascular: Negative for chest pain, orthopnea and leg swelling.  Gastrointestinal: Negative for nausea.  Skin: Negative for rash.  Neurological: Negative for dizziness, sensory change, speech change, focal weakness and headaches.    The problem list and medications were reviewed and updated by myself where necessary and exist elsewhere in the encounter.   OBJECTIVE:  BP (!) 150/90 (BP Location: Left Arm, Patient Position: Sitting, Cuff Size: Large)   Pulse 86   Temp 98.6 F (37 C) (Oral)   Ht 5\' 3"  (1.6 m)   Wt 152 lb 6.4 oz (69.1 kg)   LMP 12/04/2017   SpO2 99%   BMI 27.00 kg/m   Physical Exam  Constitutional: She is active.   Non-toxic appearance.  HENT:  Right Ear: Hearing, tympanic membrane, external ear and ear canal normal.  Left Ear: Hearing, tympanic membrane, external ear and ear canal normal.  Nose: Nose normal. Right sinus exhibits no maxillary sinus tenderness and no frontal sinus tenderness. Left sinus exhibits no maxillary sinus tenderness and no frontal sinus tenderness.  Mouth/Throat: Uvula is midline, oropharynx is clear and moist and mucous membranes are normal. Mucous membranes are not dry. No oropharyngeal exudate, posterior oropharyngeal edema or tonsillar abscesses.  Cardiovascular: Normal rate, regular rhythm, S1 normal, S2 normal, normal heart sounds and intact distal pulses. Exam reveals no gallop, no friction rub and no decreased pulses.  No murmur heard. Pulmonary/Chest: Effort normal. No stridor. No tachypnea. No respiratory distress. She has no wheezes. She has no rales.  Abdominal: She exhibits no distension.  Musculoskeletal: She exhibits no edema.  Lymphadenopathy:       Head (right side): No submandibular and no tonsillar adenopathy present.       Head (left side): No submandibular and no tonsillar adenopathy present.    She has no cervical adenopathy.  Neurological: She is alert.  Skin: Skin is warm and dry. She is not diaphoretic. No pallor.    No results found for this or any previous visit (from the past 72 hour(s)).  No results found.  ASSESSMENT AND PLAN:  Jamariah was seen today for hypertension.  Diagnoses and all orders for this visit:  Uncontrolled hypertension: We will screen for other cardiac risk factors.  Starting amlodipine 5 mg which she has had in the past.  She will monitor her blood pressure and send me her log via my chart in about a week to 2 weeks.  I expect the 5 mg of Norvasc will likely be enough medication as her hypertension appears to be mild. -     amLODipine (NORVASC) 5 MG tablet; Take 1 tablet (5 mg total) by mouth daily. -     CBC -      Comprehensive metabolic panel -     TSH -     Hemoglobin A1c -     Lipid Panel    The patient is advised to call or return to clinic if she does not see an improvement in symptoms, or to seek the care of the closest emergency department if she worsens with the above plan.   Deliah Boston, MHS, PA-C Primary Care at [facility] 12/23/2017 6:02 PM

## 2017-12-25 LAB — COMPREHENSIVE METABOLIC PANEL
ALBUMIN: 4.2 g/dL (ref 3.5–5.5)
ALK PHOS: 89 IU/L (ref 39–117)
ALT: 11 IU/L (ref 0–32)
AST: 11 IU/L (ref 0–40)
Albumin/Globulin Ratio: 1.7 (ref 1.2–2.2)
BUN/Creatinine Ratio: 10 (ref 9–23)
BUN: 8 mg/dL (ref 6–20)
Bilirubin Total: <0.2 mg/dL (ref 0.0–1.2)
CO2: 23 mmol/L (ref 20–29)
CREATININE: 0.84 mg/dL (ref 0.57–1.00)
Calcium: 9.3 mg/dL (ref 8.7–10.2)
Chloride: 102 mmol/L (ref 96–106)
GFR calc Af Amer: 105 mL/min/{1.73_m2} (ref >59)
GFR calc non Af Amer: 91 mL/min/{1.73_m2} (ref >59)
Globulin, Total: 2.5 g/dL (ref 1.5–4.5)
Glucose: 82 mg/dL (ref 65–99)
Potassium: 4.1 mmol/L (ref 3.5–5.2)
Sodium: 138 mmol/L (ref 134–144)
Total Protein: 6.7 g/dL (ref 6.0–8.5)

## 2017-12-25 LAB — HEMOGLOBIN A1C
ESTIMATED AVERAGE GLUCOSE: 105 mg/dL
HEMOGLOBIN A1C: 5.3 % (ref 4.8–5.6)

## 2017-12-25 LAB — LIPID PANEL
Chol/HDL Ratio: 2.9 ratio (ref 0.0–4.4)
Cholesterol, Total: 139 mg/dL (ref 100–199)
HDL: 48 mg/dL (ref >39)
LDL Calculated: 66 mg/dL (ref 0–99)
Triglycerides: 126 mg/dL (ref 0–149)
VLDL Cholesterol Cal: 25 mg/dL (ref 5–40)

## 2017-12-25 LAB — CBC
HEMOGLOBIN: 7.6 g/dL — AB (ref 11.1–15.9)
Hematocrit: 26.3 % — ABNORMAL LOW (ref 34.0–46.6)
MCH: 18.2 pg — ABNORMAL LOW (ref 26.6–33.0)
MCHC: 28.9 g/dL — AB (ref 31.5–35.7)
MCV: 63 fL — ABNORMAL LOW (ref 79–97)
Platelets: 479 10*3/uL — ABNORMAL HIGH (ref 150–379)
RBC: 4.18 x10E6/uL (ref 3.77–5.28)
RDW: 19.3 % — ABNORMAL HIGH (ref 12.3–15.4)
WBC: 6.8 10*3/uL (ref 3.4–10.8)

## 2017-12-25 LAB — TSH: TSH: 0.577 u[IU]/mL (ref 0.450–4.500)

## 2017-12-26 DIAGNOSIS — R718 Other abnormality of red blood cells: Secondary | ICD-10-CM | POA: Diagnosis not present

## 2017-12-26 NOTE — Addendum Note (Signed)
Addended by: Baldwin CrownJOHNSON, SHAQUETTA D on: 12/26/2017 09:26 AM   Modules accepted: Orders

## 2017-12-27 ENCOUNTER — Other Ambulatory Visit: Payer: Self-pay | Admitting: Physician Assistant

## 2017-12-27 LAB — IRON,TIBC AND FERRITIN PANEL
FERRITIN: 5 ng/mL — AB (ref 15–150)
Iron Saturation: 3 % — CL (ref 15–55)
Iron: 14 ug/dL — ABNORMAL LOW (ref 27–159)
Total Iron Binding Capacity: 426 ug/dL (ref 250–450)
UIBC: 412 ug/dL (ref 131–425)

## 2017-12-27 MED ORDER — FERROUS GLUCONATE 324 (38 FE) MG PO TABS: ORAL_TABLET | ORAL | 0 refills | Status: DC

## 2017-12-27 NOTE — Progress Notes (Signed)
Patient advised via voicemail of iron deficiency and I have escribed iron.  Recheck in one month.Kelly BostonMichael Clark, MS, PA-C 11:55 AM, 12/27/2017

## 2018-02-03 ENCOUNTER — Other Ambulatory Visit: Payer: Self-pay | Admitting: Physician Assistant

## 2018-02-03 NOTE — Telephone Encounter (Signed)
Ferrous gluconate refill Last OV: 12/27/17 Last Refill:12/27/17 #90 tabs No RF Pharmacy:[facility] [id number] PCP: Deliah Boston PA-C

## 2018-02-03 NOTE — Telephone Encounter (Signed)
Refill request ferrous gluconate 324 mg denied.  Pt advised to return to office in one month for recheck per Clark.  No future appt scheduled.  Will send to schedulers to schedule iron recheck with Clark. Dgaddy, CMA

## 2018-02-04 NOTE — Telephone Encounter (Signed)
MyChart message sent to pt about making an apt. °

## 2018-06-19 DIAGNOSIS — J0100 Acute maxillary sinusitis, unspecified: Secondary | ICD-10-CM | POA: Diagnosis not present

## 2018-06-19 DIAGNOSIS — R05 Cough: Secondary | ICD-10-CM | POA: Diagnosis not present

## 2018-06-19 DIAGNOSIS — R062 Wheezing: Secondary | ICD-10-CM | POA: Diagnosis not present

## 2018-09-14 ENCOUNTER — Encounter: Payer: Self-pay | Admitting: Family Medicine

## 2018-09-14 ENCOUNTER — Other Ambulatory Visit: Payer: Self-pay

## 2018-09-14 ENCOUNTER — Ambulatory Visit: Payer: 59 | Admitting: Family Medicine

## 2018-09-14 VITALS — BP 147/103 | HR 82 | Temp 98.7°F | Resp 18 | Ht 63.0 in | Wt 155.2 lb

## 2018-09-14 DIAGNOSIS — Z862 Personal history of diseases of the blood and blood-forming organs and certain disorders involving the immune mechanism: Secondary | ICD-10-CM

## 2018-09-14 DIAGNOSIS — I10 Essential (primary) hypertension: Secondary | ICD-10-CM | POA: Diagnosis not present

## 2018-09-14 DIAGNOSIS — M533 Sacrococcygeal disorders, not elsewhere classified: Secondary | ICD-10-CM

## 2018-09-14 LAB — POCT CBC
Granulocyte percent: 3.9 %G — AB (ref 37–80)
HCT, POC: 31.3 % (ref 29–41)
HEMOGLOBIN: 9.8 g/dL — AB (ref 11–14.6)
LYMPH, POC: 35.1 — AB (ref 0.6–3.4)
MCH: 21.8 pg — AB (ref 27–31.2)
MCHC: 31.3 g/dL — AB (ref 31.8–35.4)
MCV: 69.6 fL — AB (ref 76–111)
MID (cbc): 60.1 — AB (ref 0–0.9)
MPV: 7.7 fL (ref 0–99.8)
PLATELET COUNT, POC: 570 10*3/uL — AB (ref 142–424)
POC Granulocyte: 0.3 — AB (ref 2–6.9)
POC LYMPH PERCENT: 4.8 %L — AB (ref 10–50)
POC MID %: 2.3 % (ref 0–12)
RBC: 4.5 M/uL (ref 4.04–5.48)
RDW, POC: 17.6 %
WBC: 6.5 10*3/uL (ref 4.6–10.2)

## 2018-09-14 MED ORDER — AMLODIPINE BESYLATE 5 MG PO TABS: 5.0000 mg | ORAL_TABLET | Freq: Every day | ORAL | 1 refills | Status: DC

## 2018-09-14 MED ORDER — DICLOFENAC SODIUM 75 MG PO TBEC: 75.0000 mg | DELAYED_RELEASE_TABLET | Freq: Two times a day (BID) | ORAL | 0 refills | Status: DC

## 2018-09-14 NOTE — Progress Notes (Signed)
Patient ID: Kelly Barrett, female    DOB: 1981-06-04  Age: 37 y.o. MRN: [id number]  Chief Complaint  Patient presents with  . Hip Pain    left hip area hurts when sitting and walking x2weeks   . Medication Refill    amlodipine    Subjective:   Pleasant lady in no major distress who comes in today with a 2 out of left hip pain.  She sits all day typing.  She is not been doing any major physical activity or had any injuries that might have attributed to the pain, it just came on fairly quickly and has persisted hurting her quite a lot.  She did take a few ibuprofen over-the-counter.  She has been out of her blood pressure medication while and needs to get that refilled.  She has a history of iron deficiency anemia, was on iron earlier in the year but is not taking any now.  Current allergies, medications, problem list, past/family and social histories reviewed.  Objective:  BP (!) 147/103   Pulse 82   Temp 98.7 F (37.1 C) (Oral)   Resp 18   Ht 5\' 3"  (1.6 m)   Wt 155 lb 3.2 oz (70.4 kg)   LMP 08/10/2018   SpO2 99%   BMI 27.49 kg/m   No major acute distress.  She is tender in the left SI joint area.  Has fairly good range of motion of her low back and hips.  On abduction and rotation of the left hip there is increased pain in that joint area.  Conjunctiva are a little pale.  Chest clear and heart regular.  Assessment & Plan:   Assessment: No diagnosis found.    Plan: Check CBC, BMP, and have her put back on the amlodipine to follow-up again in about 3 months.  We will give diclofenac for her SI joint, but she is to stop it if she is having increased abdominal pain from it.  She denies abdominal pain at this time.  No orders of the defined types were placed in this encounter.   No orders of the defined types were placed in this encounter.        Patient Instructions       If you have lab work done today you will be contacted with your lab results within  the next 2 weeks.  If you have not heard from us then please contact us. The fastest way to get your results is to register for My Chart.   IF you received an x-ray today, you will receive an invoice from [facility]. Please contact [facility] at [phone] with questions or concerns regarding your invoice.   IF you received labwork today, you will receive an invoice from [facility]. Please contact [facility] at [phone] with questions or concerns regarding your invoice.   Our billing staff will not be able to assist you with questions regarding bills from these companies.  You will be contacted with the lab results as soon as they are available. The fastest way to get your results is to activate your My Chart account. Instructions are located on the last page of this paperwork. If you have not heard from us regarding the results in 2 weeks, please contact this office.        No follow-ups on file.   Janace Hoardavid Shereka Lafortune, MD 09/14/2018

## 2018-09-14 NOTE — Patient Instructions (Addendum)
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  Take diclofenac 75 mg 1 twice daily for 7 to 10 days.  Take the diclofenac after eating when you have food in your stomach.  If it upsets her stomach discontinue it.  Avoid excessive activity that might be straining your low back and pelvis.  You can take acetaminophen (Tylenol) 500 mg 2 pills 3 times daily in addition to the diclofenac.  This typically will come down with a little bit of for the next couple of weeks.  If you continue having excessive pain we may need you to be referred to physical therapy.  Take the amlodipine 1 daily for blood pressure  Plan to return in about March to see 1 of the primary care doctors to follow-up on your blood pressure.  Please set up that appointment on your way out. .       If you have lab work done today you will be contacted with your lab results within the next 2 weeks.  If you have not heard from us then please contact us. The fastest way to get your results is to register for My Chart.   IF you received an x-ray today, you will receive an invoice from [facility]. Please contact [facility] at [phone] with questions or concerns regarding your invoice.   IF you received labwork today, you will receive an invoice from [facility]. Please contact [facility] at [phone] with questions or concerns regarding your invoice.   Our billing staff will not be able to assist you with questions regarding bills from these companies.  You will be contacted with the lab results as soon as they are available. The fastest way to get your results is to activate your My Chart account. Instructions are located on the last page of this paperwork. If you have not heard from us regarding the results in 2 weeks, please contact this office.

## 2018-09-15 LAB — BASIC METABOLIC PANEL
BUN/Creatinine Ratio: 11 (ref 9–23)
BUN: 9 mg/dL (ref 6–20)
CALCIUM: 9.4 mg/dL (ref 8.7–10.2)
CO2: 24 mmol/L (ref 20–29)
CREATININE: 0.84 mg/dL (ref 0.57–1.00)
Chloride: 103 mmol/L (ref 96–106)
GFR calc Af Amer: 104 mL/min/{1.73_m2} (ref >59)
GFR calc non Af Amer: 90 mL/min/{1.73_m2} (ref >59)
Glucose: 72 mg/dL (ref 65–99)
Potassium: 4.2 mmol/L (ref 3.5–5.2)
Sodium: 139 mmol/L (ref 134–144)

## 2018-09-15 LAB — IRON AND TIBC
Iron Saturation: 8 % — CL (ref 15–55)
Iron: 36 ug/dL (ref 27–159)
Total Iron Binding Capacity: 447 ug/dL (ref 250–450)
UIBC: 411 ug/dL (ref 131–425)

## 2018-10-24 ENCOUNTER — Telehealth: Payer: Self-pay | Admitting: Family Medicine

## 2018-10-24 NOTE — Telephone Encounter (Signed)
LVM for pt to call the office and reschedule their appt that was scheduled for 12/15/18 with Dr. Stallings. Due to a provider being out of the office, pt will need to reschedule. When pt calls back, please reschedule at their convenience. Thank you! °

## 2018-12-15 ENCOUNTER — Ambulatory Visit: Payer: 59 | Admitting: Family Medicine

## 2019-06-10 ENCOUNTER — Ambulatory Visit
Admission: EM | Admit: 2019-06-10 | Discharge: 2019-06-10 | Disposition: A | Discharge status: Home / Self Care | Payer: 59 | Attending: Emergency Medicine | Admitting: Emergency Medicine

## 2019-06-10 ENCOUNTER — Encounter: Payer: Self-pay | Admitting: Emergency Medicine

## 2019-06-10 ENCOUNTER — Other Ambulatory Visit: Payer: Self-pay

## 2019-06-10 DIAGNOSIS — N3001 Acute cystitis with hematuria: Secondary | ICD-10-CM | POA: Diagnosis not present

## 2019-06-10 DIAGNOSIS — B373 Candidiasis of vulva and vagina: Secondary | ICD-10-CM | POA: Insufficient documentation

## 2019-06-10 DIAGNOSIS — R350 Frequency of micturition: Secondary | ICD-10-CM | POA: Diagnosis not present

## 2019-06-10 DIAGNOSIS — B3731 Acute candidiasis of vulva and vagina: Secondary | ICD-10-CM

## 2019-06-10 LAB — POCT URINALYSIS DIP (MANUAL ENTRY)
Bilirubin, UA: NEGATIVE
Glucose, UA: NEGATIVE mg/dL
Nitrite, UA: NEGATIVE
Protein Ur, POC: 30 mg/dL — AB
Spec Grav, UA: 1.025 (ref 1.010–1.025)
Urobilinogen, UA: 1 E.U./dL
pH, UA: 5.5 (ref 5.0–8.0)

## 2019-06-10 MED ORDER — CEPHALEXIN 500 MG PO CAPS: 500.0000 mg | ORAL_CAPSULE | Freq: Two times a day (BID) | ORAL | 0 refills | Status: AC

## 2019-06-10 MED ORDER — FLUCONAZOLE 200 MG PO TABS: 200.0000 mg | ORAL_TABLET | Freq: Once | ORAL | 0 refills | Status: AC

## 2019-06-10 NOTE — ED Provider Notes (Signed)
[facility]    CSN: [id number] Arrival date & time: 06/10/19  1253      History   Chief Complaint Chief Complaint  Patient presents with  . Urinary Tract Infection    HPI Kelly Barrett is a 38 y.o. female with history of hypertension presenting for urinary frequency, urgency, dysuria since Thursday.  Patient denies history of frequent UTI, pyelonephritis, renal calculi.  Denies hematuria, fever, flank pain.  Patient also noting vaginal irritation/pruritus with thick and white discharge since being at the beach the other week.  Tried Monistat without significant relief.  States she also gets yeast infections after antibiotic use.  Patient currently sexually active 1 female partner, status post bilateral tubal ligation.   Past Medical History:  Diagnosis Date  . Allergy   . Anemia   . Depression   . Fibromyalgia    DX back in HS 2002-2003  . History of HPV infection   . Hypertension   . Pyloric stenosis    repaired as infant    There are no active problems to display for this patient.   Past Surgical History:  Procedure Laterality Date  . CHOLECYSTECTOMY    . TUBAL LIGATION Bilateral 07/11/2014   Procedure: POST PARTUM TUBAL LIGATION;  Surgeon: Sanjuana Kava, MD;  Location: [facility];  Service: Gynecology;  Laterality: Bilateral;    OB History    Gravida  2   Para  2   Term  2   Preterm      AB      Living  2     SAB      TAB      Ectopic      Multiple      Live Births  2            Home Medications    Prior to Admission medications   Medication Sig Start Date End Date Taking? Authorizing Provider  amLODipine (NORVASC) 5 MG tablet Take 1 tablet (5 mg total) by mouth daily. 09/14/18   Posey Boyer, MD  cephALEXin (KEFLEX) 500 MG capsule Take 1 capsule (500 mg total) by mouth 2 (two) times daily for 5 days. 06/10/19 06/15/19  Hall-Potvin, [place], PA-C  diclofenac (VOLTAREN) 75 MG EC tablet Take 1 tablet (75 mg total) by mouth 2  (two) times daily. 09/14/18   Posey Boyer, MD  ferrous gluconate (FERGON) 324 MG tablet Take 3 daily with food for seven days, then start 2 daily with food thereafter. Do not miss doses. 12/27/17   Tereasa Coop, PA-C  fluconazole (DIFLUCAN) 200 MG tablet Take 1 tablet (200 mg total) by mouth once for 1 dose. May repeat in 72 hours if needed 06/10/19 06/10/19  Hall-Potvin, [place], PA-C    Family History Family History  Problem Relation Age of Onset  . Hypertension Mother   . Diabetes Mother   . Diabetes Father   . Hypertension Sister   . Asthma Son   . Allergies Son     Social History Social History   Tobacco Use  . Smoking status: Never Smoker  . Smokeless tobacco: Never Used  Substance Use Topics  . Alcohol use: No  . Drug use: No     Allergies   Patient has no known allergies.   Review of Systems Review of Systems  Constitutional: Negative for fatigue and fever.  Respiratory: Negative for cough and shortness of breath.   Cardiovascular: Negative for chest pain and palpitations.  Gastrointestinal: Negative  for constipation and diarrhea.  Genitourinary: Positive for dysuria, frequency and urgency. Negative for flank pain, hematuria, pelvic pain, vaginal bleeding, vaginal discharge and vaginal pain.     Physical Exam Triage Vital Signs ED Triage Vitals  Enc Vitals Group     BP 06/10/19 1304 (!) 148/96     Pulse Rate 06/10/19 1304 76     Resp 06/10/19 1304 16     Temp 06/10/19 1305 98.5 F (36.9 C)     Temp Source 06/10/19 1305 Oral     SpO2 06/10/19 1304 98 %     Weight --      Height --      Head Circumference --      Peak Flow --      Pain Score 06/10/19 1307 0     Pain Loc --      Pain Edu? --      Excl. in GC? --    No data found.  Updated Vital Signs BP (!) 148/96 (BP Location: Right Arm)   Pulse 76   Temp 98.5 F (36.9 C) (Oral)   Resp 16   SpO2 98%   Visual Acuity Right Eye Distance:   Left Eye Distance:   Bilateral Distance:     Right Eye Near:   Left Eye Near:    Bilateral Near:     Physical Exam Constitutional:      General: She is not in acute distress. HENT:     Head: Normocephalic and atraumatic.  Eyes:     General: No scleral icterus.    Pupils: Pupils are equal, round, and reactive to light.  Cardiovascular:     Rate and Rhythm: Normal rate.  Pulmonary:     Effort: Pulmonary effort is normal.  Abdominal:     General: Bowel sounds are normal.     Palpations: Abdomen is soft.     Tenderness: There is no abdominal tenderness. There is no right CVA tenderness, left CVA tenderness or guarding.  Genitourinary:    Comments: Patient declined Skin:    Coloration: Skin is not jaundiced or pale.  Neurological:     Mental Status: She is alert and oriented to person, place, and time.      UC Treatments / Results  Labs (all labs ordered are listed, but only abnormal results are displayed) Labs Reviewed  POCT URINALYSIS DIP (MANUAL ENTRY) - Abnormal; Notable for the following components:      Result Value   Ketones, POC UA trace (5) (*)    Blood, UA trace-intact (*)    Protein Ur, POC =30 (*)    Leukocytes, UA Trace (*)    All other components within normal limits  URINE CULTURE    EKG   Radiology No results found.  Procedures Procedures (including critical care time)  Medications Ordered in UC Medications - No data to display  Initial Impression / Assessment and Plan / UC Course  I have reviewed the triage vital signs and the nursing notes.  Pertinent labs & imaging results that were available during my care of the patient were reviewed by me and considered in my medical decision making (see chart for details).     1.  Acute cystitis with hematuria POCT urinalysis done in office, reviewed by me: Positive for leukocytes, protein, trace intact blood, ketones.  Urine culture pending, will initiate Keflex today.  2.  Vaginal yeast infection Diflucan sent: Quantity 2 do so the patient  can take 1 tab today, 1  at the end of her antibiotic course to prevent post antibiotic use candidiasis.  Return precautions discussed, patient verbalized understanding and is agreeable to plan. Final Clinical Impressions(s) / UC Diagnoses   Final diagnoses:  Acute cystitis with hematuria  Vaginal yeast infection     Discharge Instructions     Take antibiotic as prescribed. Keep area clean and dry: Important to wipe from front to back after urinating. It can be helpful to void after intercourse as well to prevent UTIs. Return for worsening symptoms, abdominal/back pain, fever.    ED Prescriptions    Medication Sig Dispense Auth. Provider   cephALEXin (KEFLEX) 500 MG capsule Take 1 capsule (500 mg total) by mouth 2 (two) times daily for 5 days. 10 capsule Hall-Potvin, [place], PA-C   fluconazole (DIFLUCAN) 200 MG tablet Take 1 tablet (200 mg total) by mouth once for 1 dose. May repeat in 72 hours if needed 2 tablet Hall-Potvin, [place], PA-C     Controlled Substance Prescriptions [place] Controlled Substance Registry consulted? Not Applicable   Shea EvansHall-Potvin, Brittany, [place]PA-C 06/10/19 1328

## 2019-06-10 NOTE — Discharge Instructions (Addendum)
Take antibiotic as prescribed. Keep area clean and dry: Important to wipe from front to back after urinating. It can be helpful to void after intercourse as well to prevent UTIs. Return for worsening symptoms, abdominal/back pain, fever.

## 2019-06-10 NOTE — ED Triage Notes (Signed)
Per pt she has been having urinary frequency and some pressure since Thursday. No burning or blood. No fevers, no abdominal pain

## 2019-06-11 ENCOUNTER — Telehealth: Payer: Self-pay | Admitting: Emergency Medicine

## 2019-06-11 NOTE — Telephone Encounter (Signed)
Left voicemail checking in on patient, discussed medications, and encouraged return call with any continuing questions or concerns.    

## 2019-06-12 LAB — URINE CULTURE: Culture: NO GROWTH

## 2019-12-10 ENCOUNTER — Encounter: Payer: Self-pay | Admitting: Registered Nurse

## 2019-12-10 ENCOUNTER — Other Ambulatory Visit: Payer: Self-pay

## 2019-12-10 ENCOUNTER — Ambulatory Visit: Payer: 59 | Admitting: Registered Nurse

## 2019-12-10 VITALS — BP 165/117 | HR 75 | Temp 97.7°F | Ht 63.0 in | Wt 159.6 lb

## 2019-12-10 DIAGNOSIS — Z1329 Encounter for screening for other suspected endocrine disorder: Secondary | ICD-10-CM

## 2019-12-10 DIAGNOSIS — Z13228 Encounter for screening for other metabolic disorders: Secondary | ICD-10-CM

## 2019-12-10 DIAGNOSIS — Z130 Encounter for screening for diseases of the blood and blood-forming organs and certain disorders involving the immune mechanism: Secondary | ICD-10-CM

## 2019-12-10 DIAGNOSIS — Z13220 Encounter for screening for lipoid disorders: Secondary | ICD-10-CM

## 2019-12-10 DIAGNOSIS — I10 Essential (primary) hypertension: Secondary | ICD-10-CM | POA: Diagnosis not present

## 2019-12-10 MED ORDER — AMLODIPINE BESYLATE 5 MG PO TABS: 5.0000 mg | ORAL_TABLET | Freq: Every day | ORAL | 0 refills | Status: DC

## 2019-12-10 NOTE — Patient Instructions (Signed)
° ° ° °  If you have lab work done today you will be contacted with your lab results within the next 2 weeks.  If you have not heard from us then please contact us. The fastest way to get your results is to register for My Chart. ° ° °IF you received an x-ray today, you will receive an invoice from [facility]. Please contact [facility] at [phone] with questions or concerns regarding your invoice.  ° °IF you received labwork today, you will receive an invoice from [facility]. Please contact [facility] at [phone] with questions or concerns regarding your invoice.  ° °Our billing staff will not be able to assist you with questions regarding bills from these companies. ° °You will be contacted with the lab results as soon as they are available. The fastest way to get your results is to activate your My Chart account. Instructions are located on the last page of this paperwork. If you have not heard from us regarding the results in 2 weeks, please contact this office. °  ° ° ° °

## 2019-12-10 NOTE — Progress Notes (Signed)
Established Patient Office Visit  Subjective:  Patient ID: Kelly Barrett, female    DOB: May 04, 1981  Age: 39 y.o. MRN: [id number]  CC:  Chief Complaint  Patient presents with  . Medication Refill    amlodipine . patient states bp been elevated on and off.    HPI Kelly Barrett presents for visit to establish care and HTN  Has been on amlodipine 5mg  PO qd with good effect in past. Denies CV symptoms. Has been off amlodipine for a number of months. Otherwise, no concerns today. Feeling well overall  We discussed the pathophysiology of complications from untreated htn  Past Medical History:  Diagnosis Date  . Allergy   . Anemia   . Depression   . Fibromyalgia    DX back in HS 2002-2003  . History of HPV infection   . Hypertension   . Pyloric stenosis    repaired as infant    Past Surgical History:  Procedure Laterality Date  . CHOLECYSTECTOMY    . TUBAL LIGATION Bilateral 07/11/2014   Procedure: POST PARTUM TUBAL LIGATION;  Surgeon: Sanjuana Kava, MD;  Location: [facility];  Service: Gynecology;  Laterality: Bilateral;    Family History  Problem Relation Age of Onset  . Hypertension Mother   . Diabetes Mother   . Diabetes Father   . Hypertension Sister   . Asthma Son   . Allergies Son     Social History   Socioeconomic History  . Marital status: Single    Spouse name: Not on file  . Number of children: Not on file  . Years of education: Not on file  . Highest education level: Not on file  Occupational History  . Not on file  Tobacco Use  . Smoking status: Never Smoker  . Smokeless tobacco: Never Used  Substance and Sexual Activity  . Alcohol use: No  . Drug use: No  . Sexual activity: Yes    Partners: Male    Birth control/protection: Other-see comments    Comment: NuvaRing  Other Topics Concern  . Not on file  Social History Narrative   Lives with her son.   Social Determinants of Health   Financial Resource Strain:   . Difficulty of Paying  Living Expenses:   Food Insecurity:   . Worried About Charity fundraiser in the Last Year:   . Arboriculturist in the Last Year:   Transportation Needs:   . Film/video editor (Medical):   Marland Kitchen Lack of Transportation (Non-Medical):   Physical Activity:   . Days of Exercise per Week:   . Minutes of Exercise per Session:   Stress:   . Feeling of Stress :   Social Connections:   . Frequency of Communication with Friends and Family:   . Frequency of Social Gatherings with Friends and Family:   . Attends Religious Services:   . Active Member of Clubs or Organizations:   . Attends Archivist Meetings:   Marland Kitchen Marital Status:   Intimate Partner Violence:   . Fear of Current or Ex-Partner:   . Emotionally Abused:   Marland Kitchen Physically Abused:   . Sexually Abused:     Outpatient Medications Prior to Visit  Medication Sig Dispense Refill  . diclofenac (VOLTAREN) 75 MG EC tablet Take 1 tablet (75 mg total) by mouth 2 (two) times daily. 20 tablet 0  . ferrous gluconate (FERGON) 324 MG tablet Take 3 daily with food for seven days, then start  2 daily with food thereafter. Do not miss doses. 90 tablet 0  . amLODipine (NORVASC) 5 MG tablet Take 1 tablet (5 mg total) by mouth daily. 90 tablet 1   No facility-administered medications prior to visit.    No Known Allergies  ROS Review of Systems  Constitutional: Negative.   HENT: Negative.   Eyes: Negative.   Respiratory: Negative.   Cardiovascular: Negative.   Gastrointestinal: Negative.   Endocrine: Negative.   Genitourinary: Negative.   Musculoskeletal: Negative.   Skin: Negative.   Allergic/Immunologic: Negative.   Neurological: Negative.   Hematological: Negative.   Psychiatric/Behavioral: Negative.   All other systems reviewed and are negative.     Objective:    Physical Exam  Constitutional: She is oriented to person, place, and time. She appears well-developed and well-nourished. No distress.  Cardiovascular: Normal  rate, regular rhythm and normal heart sounds. Exam reveals no gallop and no friction rub.  No murmur heard. Pulmonary/Chest: Effort normal and breath sounds normal. No respiratory distress. She has no wheezes. She has no rales. She exhibits no tenderness.  Neurological: She is alert and oriented to person, place, and time.  Skin: Skin is warm and dry. No rash noted. She is not diaphoretic. No erythema. No pallor.  Psychiatric: She has a normal mood and affect. Her behavior is normal. Judgment and thought content normal.  Nursing note and vitals reviewed.   BP (!) 165/117   Pulse 75   Temp 97.7 F (36.5 C) (Temporal)   Ht 5\' 3"  (1.6 m)   Wt 159 lb 9.6 oz (72.4 kg)   SpO2 100%   BMI 28.27 kg/m  Wt Readings from Last 3 Encounters:  12/10/19 159 lb 9.6 oz (72.4 kg)  09/14/18 155 lb 3.2 oz (70.4 kg)  12/23/17 152 lb 6.4 oz (69.1 kg)     Health Maintenance Due  Topic Date Due  . PAP SMEAR-Modifier  07/22/2019    There are no preventive care reminders to display for this patient.  Lab Results  Component Value Date   TSH 0.577 12/23/2017   Lab Results  Component Value Date   WBC 6.5 09/14/2018   HGB 9.8 (A) 09/14/2018   HCT 31.3 09/14/2018   MCV 69.6 (A) 09/14/2018   PLT 479 (H) 12/23/2017   Lab Results  Component Value Date   NA 139 09/14/2018   K 4.2 09/14/2018   CO2 24 09/14/2018   GLUCOSE 72 09/14/2018   BUN 9 09/14/2018   CREATININE 0.84 09/14/2018   BILITOT <0.2 12/23/2017   ALKPHOS 89 12/23/2017   AST 11 12/23/2017   ALT 11 12/23/2017   PROT 6.7 12/23/2017   ALBUMIN 4.2 12/23/2017   CALCIUM 9.4 09/14/2018   ANIONGAP 11 07/04/2014   Lab Results  Component Value Date   CHOL 139 12/23/2017   Lab Results  Component Value Date   HDL 48 12/23/2017   Lab Results  Component Value Date   LDLCALC 66 12/23/2017   Lab Results  Component Value Date   TRIG 126 12/23/2017   Lab Results  Component Value Date   CHOLHDL 2.9 12/23/2017   Lab Results    Component Value Date   HGBA1C 5.3 12/23/2017      Assessment & Plan:   Problem List Items Addressed This Visit    None    Visit Diagnoses    Screening for endocrine, metabolic and immunity disorder    -  Primary   Relevant Orders   CBC With Differential  Comprehensive metabolic panel   TSH   Hemoglobin A1c   Essential hypertension       Relevant Medications   amLODipine (NORVASC) 5 MG tablet   Other Relevant Orders   CBC With Differential   Comprehensive metabolic panel   Hemoglobin A1c   Lipid screening       Relevant Orders   Lipid panel   Uncontrolled hypertension       Relevant Medications   amLODipine (NORVASC) 5 MG tablet      Meds ordered this encounter  Medications  . amLODipine (NORVASC) 5 MG tablet    Sig: Take 1 tablet (5 mg total) by mouth daily.    Dispense:  30 tablet    Refill:  0    Order Specific Question:   Supervising Provider    Answer:   Doristine Bosworth [id number]    Follow-up: Return in about 2 weeks (around 12/24/2019) for BP check nurse visit.   PLAN  Restart amlodipine 5mg  Po qd  Return in 2 weeks for bp check - if still above normal limits, increase to 10mg  PO qd  Labs drawn, will follow up as warranted  If labs and bp wnl, follow up in 1 year  Patient encouraged to call clinic with any questions, comments, or concerns.  , NP

## 2019-12-11 LAB — COMPREHENSIVE METABOLIC PANEL
ALT: 27 IU/L (ref 0–32)
AST: 15 IU/L (ref 0–40)
Albumin/Globulin Ratio: 1.4 (ref 1.2–2.2)
Albumin: 4.4 g/dL (ref 3.8–4.8)
Alkaline Phosphatase: 105 IU/L (ref 39–117)
BUN/Creatinine Ratio: 9 (ref 9–23)
BUN: 7 mg/dL (ref 6–20)
Bilirubin Total: 0.2 mg/dL (ref 0.0–1.2)
CO2: 23 mmol/L (ref 20–29)
Calcium: 9.2 mg/dL (ref 8.7–10.2)
Chloride: 104 mmol/L (ref 96–106)
Creatinine, Ser: 0.79 mg/dL (ref 0.57–1.00)
GFR calc Af Amer: 111 mL/min/{1.73_m2} (ref >59)
GFR calc non Af Amer: 97 mL/min/{1.73_m2} (ref >59)
Globulin, Total: 3.1 g/dL (ref 1.5–4.5)
Glucose: 91 mg/dL (ref 65–99)
Potassium: 4.2 mmol/L (ref 3.5–5.2)
Sodium: 140 mmol/L (ref 134–144)
Total Protein: 7.5 g/dL (ref 6.0–8.5)

## 2019-12-11 LAB — CBC WITH DIFFERENTIAL
Basophils Absolute: 0.1 10*3/uL (ref 0.0–0.2)
Basos: 1 %
EOS (ABSOLUTE): 0.3 10*3/uL (ref 0.0–0.4)
Eos: 6 %
Hematocrit: 32.1 % — ABNORMAL LOW (ref 34.0–46.6)
Hemoglobin: 9.4 g/dL — ABNORMAL LOW (ref 11.1–15.9)
Immature Grans (Abs): 0 10*3/uL (ref 0.0–0.1)
Immature Granulocytes: 0 %
Lymphocytes Absolute: 2.2 10*3/uL (ref 0.7–3.1)
Lymphs: 36 %
MCH: 20.8 pg — ABNORMAL LOW (ref 26.6–33.0)
MCHC: 29.3 g/dL — ABNORMAL LOW (ref 31.5–35.7)
MCV: 71 fL — ABNORMAL LOW (ref 79–97)
Monocytes Absolute: 0.3 10*3/uL (ref 0.1–0.9)
Monocytes: 5 %
Neutrophils Absolute: 3.1 10*3/uL (ref 1.4–7.0)
Neutrophils: 52 %
RBC: 4.53 x10E6/uL (ref 3.77–5.28)
RDW: 17.5 % — ABNORMAL HIGH (ref 11.7–15.4)
WBC: 6.1 10*3/uL (ref 3.4–10.8)

## 2019-12-11 LAB — LIPID PANEL
Chol/HDL Ratio: 3 ratio (ref 0.0–4.4)
Cholesterol, Total: 184 mg/dL (ref 100–199)
HDL: 62 mg/dL (ref >39)
LDL Chol Calc (NIH): 109 mg/dL — ABNORMAL HIGH (ref 0–99)
Triglycerides: 71 mg/dL (ref 0–149)
VLDL Cholesterol Cal: 13 mg/dL (ref 5–40)

## 2019-12-11 LAB — HEMOGLOBIN A1C
Est. average glucose Bld gHb Est-mCnc: 105 mg/dL
Hgb A1c MFr Bld: 5.3 % (ref 4.8–5.6)

## 2019-12-11 LAB — TSH: TSH: 11 u[IU]/mL — ABNORMAL HIGH (ref 0.450–4.500)

## 2019-12-18 ENCOUNTER — Encounter: Payer: Self-pay | Admitting: Registered Nurse

## 2019-12-18 DIAGNOSIS — R7989 Other specified abnormal findings of blood chemistry: Secondary | ICD-10-CM

## 2019-12-24 ENCOUNTER — Ambulatory Visit: Payer: 59 | Admitting: Registered Nurse

## 2019-12-24 ENCOUNTER — Other Ambulatory Visit: Payer: Self-pay

## 2019-12-24 ENCOUNTER — Other Ambulatory Visit: Payer: Self-pay | Admitting: Registered Nurse

## 2019-12-24 VITALS — BP 137/91 | HR 77

## 2019-12-24 DIAGNOSIS — Z0130 Encounter for examination of blood pressure without abnormal findings: Secondary | ICD-10-CM

## 2019-12-24 MED ORDER — AMLODIPINE BESYLATE 10 MG PO TABS: 10.0000 mg | ORAL_TABLET | Freq: Every day | ORAL | 3 refills | Status: DC

## 2019-12-24 NOTE — Progress Notes (Signed)
Increase amlodipine to 10mg  PO qd  pt demonstrates understanding   , NP

## 2019-12-24 NOTE — Progress Notes (Signed)
BP CHECK NURSE VISIT

## 2020-01-06 ENCOUNTER — Other Ambulatory Visit: Payer: Self-pay | Admitting: Registered Nurse

## 2020-01-06 DIAGNOSIS — I10 Essential (primary) hypertension: Secondary | ICD-10-CM

## 2020-02-01 ENCOUNTER — Encounter: Payer: Self-pay | Admitting: Internal Medicine

## 2020-02-01 ENCOUNTER — Other Ambulatory Visit: Payer: Self-pay

## 2020-02-01 ENCOUNTER — Ambulatory Visit: Payer: 59 | Admitting: Internal Medicine

## 2020-02-01 VITALS — BP 152/86 | HR 66 | Temp 98.6°F | Ht 63.0 in | Wt 161.6 lb

## 2020-02-01 DIAGNOSIS — E039 Hypothyroidism, unspecified: Secondary | ICD-10-CM

## 2020-02-01 LAB — T4, FREE: Free T4: 0.7 ng/dL (ref 0.60–1.60)

## 2020-02-01 LAB — TSH: TSH: 2.01 u[IU]/mL (ref 0.35–4.50)

## 2020-02-01 NOTE — Patient Instructions (Signed)

## 2020-02-01 NOTE — Progress Notes (Signed)
Name: Kelly Barrett  MRN/ DOB: [id number], December 19, 1980    Age/ Sex: 39 y.o., female    PCP: Patient, No Pcp Per   Reason for Endocrinology Evaluation: Hypothyroidism     Date of Initial Endocrinology Evaluation: 02/01/2020     HPI: [name] is a 39 y.o. female with a past medical history of Fibromyalgia and HTN. The patient presented for initial endocrinology clinic visit on 02/01/2020 for consultative assistance with her Hypothyroidism   She was noted to have an elevated TSH of 11.0 uIU/mL in 11/2019 during routine check up .   She has noted weight gain, has chronic constipation Has depression and stress  Has noted thinning of the hair  And knees pain   Denies local neck symptoms  No biotin  No radiation exposure   Father with thyroidectomy   HISTORY:  Past Medical History:  Past Medical History:  Diagnosis Date  . Allergy   . Anemia   . Depression   . Fibromyalgia    DX back in HS 2002-2003  . History of HPV infection   . Hypertension   . Pyloric stenosis    repaired as infant    Past Surgical History:  Past Surgical History:  Procedure Laterality Date  . CHOLECYSTECTOMY    . TUBAL LIGATION Bilateral 07/11/2014   Procedure: POST PARTUM TUBAL LIGATION;  Surgeon: Sanjuana Kava, MD;  Location: [facility];  Service: Gynecology;  Laterality: Bilateral;      Social History:  reports that she has never smoked. She has never used smokeless tobacco. She reports that she does not drink alcohol or use drugs.  Family History: family history includes Allergies in her son; Asthma in her son; Diabetes in her father and mother; Hypertension in her mother and sister.   HOME MEDICATIONS: Allergies as of 02/01/2020   No Known Allergies     Medication List       Accurate as of Feb 01, 2020 10:45 AM. If you have any questions, ask your nurse or doctor.        amLODipine 10 MG tablet Commonly known as: NORVASC Take 1 tablet (10 mg total) by mouth daily.     diclofenac 75 MG EC tablet Commonly known as: VOLTAREN Take 1 tablet (75 mg total) by mouth 2 (two) times daily.   ferrous gluconate 324 MG tablet Commonly known as: FERGON Take 3 daily with food for seven days, then start 2 daily with food thereafter. Do not miss doses.         REVIEW OF SYSTEMS: A comprehensive ROS was conducted with the patient and is negative except as per HPI and below:  ROS     OBJECTIVE:  VS: BP (!) 152/86 (BP Location: Left Arm, Patient Position: Sitting, Cuff Size: Normal)   Pulse 66   Temp 98.6 F (37 C)   Ht 5\' 3"  (1.6 m)   Wt 161 lb 9.6 oz (73.3 kg)   LMP 02/01/2020 (Exact Date)   SpO2 96%   BMI 28.63 kg/m    Wt Readings from Last 3 Encounters:  02/01/20 161 lb 9.6 oz (73.3 kg)  12/10/19 159 lb 9.6 oz (72.4 kg)  09/14/18 155 lb 3.2 oz (70.4 kg)     EXAM: General: Pt appears well and is in NAD  Neck: General: Supple without adenopathy. Thyroid: Thyroid size normal.  No goiter or nodules appreciated. No thyroid bruit.  Lungs: Clear with good BS bilat with no rales, rhonchi, or wheezes  Heart: Auscultation: RRR.  Abdomen: Normoactive bowel sounds, soft, nontender, without masses or organomegaly palpable  Extremities:  BL LE: No pretibial edema normal ROM and strength.  Skin: Hair: Texture and amount normal with gender appropriate distribution Skin Inspection: No rashes Skin Palpation: Skin temperature, texture, and thickness normal to palpation  Neuro: Cranial nerves: II - XII grossly intact  Motor: Normal strength throughout DTRs: 2+ and symmetric in UE without delay in relaxation phase  Mental Status: Judgment, insight: Intact Orientation: Oriented to time, place, and person Mood and affect: No depression, anxiety, or agitation     DATA REVIEWED: Results for JASLYNE, BEECK (MRN [id number]) as of 02/04/2020 08:39  Ref. Range 12/10/2019 14:48 02/01/2020 11:16  TSH Latest Ref Range: 0.35 - 4.50 uIU/mL 11.000 (H) 2.01   Triiodothyronine (T3) Latest Ref Range: 76 - 181 ng/dL  096  G8,ZMOQ(HUTMLY) Latest Ref Range: 0.60 - 1.60 ng/dL  6.50      ASSESSMENT/PLAN/RECOMMENDATIONS:   1. History of Hypothyroidism:   - No local neck symptoms - Pt with non-specific symptoms that are NOT attributed to her thyroid at this time - I suspect she had transient thyroiditis that has resolved.  - Repeat labs are normal, no intervention    F/U in 3 months   Signed electronically by: Lyndle Herrlich, MD  [facility]  [facility] [address] [place] [place], [place] [address] Phone: [phone] FAX: [phone]   CC: Patient, No Pcp Per No address on file Phone: None Fax: None   Return to Endocrinology clinic as below: Future Appointments  Date Time Provider Department Center  02/01/2020 11:10 AM Tiwan Schnitker, Konrad Dolores, MD [id number] None

## 2020-02-04 LAB — THYROID PEROXIDASE ANTIBODY: Thyroperoxidase Ab SerPl-aCnc: 17 IU/mL — ABNORMAL HIGH (ref <9)

## 2020-02-04 LAB — T3: T3, Total: 137 ng/dL (ref 76–181)

## 2020-04-20 ENCOUNTER — Other Ambulatory Visit: Payer: Self-pay | Admitting: Registered Nurse

## 2020-05-02 ENCOUNTER — Ambulatory Visit: Payer: 59 | Admitting: Internal Medicine

## 2020-05-02 ENCOUNTER — Other Ambulatory Visit: Payer: Self-pay

## 2020-05-02 ENCOUNTER — Encounter: Payer: Self-pay | Admitting: Internal Medicine

## 2020-05-02 VITALS — BP 142/88 | HR 97 | Ht 63.0 in | Wt 161.6 lb

## 2020-05-02 DIAGNOSIS — E063 Autoimmune thyroiditis: Secondary | ICD-10-CM

## 2020-05-02 LAB — T4, FREE: Free T4: 0.91 ng/dL (ref 0.60–1.60)

## 2020-05-02 LAB — TSH: TSH: 0.93 u[IU]/mL (ref 0.35–4.50)

## 2020-05-02 NOTE — Patient Instructions (Signed)
-   I would recommend yearly thyroid function test through your primary care physician or sooner should there be any concern about your thyroid

## 2020-05-02 NOTE — Progress Notes (Signed)
Name: Kelly Barrett  MRN/ DOB: [id number], [birth year]    Age/ Sex: 39 y.o., female     PCP: Patient, No Pcp Per   Reason for Endocrinology Evaluation: Hypothyroidism     Initial Endocrinology Clinic Visit: 02/01/2020    PATIENT IDENTIFIER: Kelly Barrett is a 39 y.o., female with a past medical history of Fibromyalgia and HTN. She has followed with [facility] since 02/01/2020 for consultative assistance with management of her Hypothyroidism   HISTORICAL SUMMARY:  She was noted to have an elevated TSH of 11.0 uIU/mL in 11/2019 during routine check up . Repeat labs came back normal without any treatment, and we suspected a form of thyroiditis    Father with thyroidectomy   SUBJECTIVE:   Today (05/03/2020):  Kelly Barrett for Hx of hypotyhyroidism   Weight has been stable   Has chronic constipation  No local neck symptoms    ROS:  As per HPI.   HISTORY:  Past Medical History:  Past Medical History:  Diagnosis Date  . Allergy   . Anemia   . Depression   . Fibromyalgia    DX back in HS 2002-2003  . History of HPV infection   . Hypertension   . Pyloric stenosis    repaired as infant   Past Surgical History:  Past Surgical History:  Procedure Laterality Date  . CHOLECYSTECTOMY    . TUBAL LIGATION Bilateral 07/11/2014   Procedure: POST PARTUM TUBAL LIGATION;  Surgeon: Essie Hart, MD;  Location: [facility];  Service: Gynecology;  Laterality: Bilateral;    Social History:  reports that she has never smoked. She has never used smokeless tobacco. She reports that she does not drink alcohol and does not use drugs. Family History:  Family History  Problem Relation Age of Onset  . Hypertension Mother   . Diabetes Mother   . Diabetes Father   . Hypertension Sister   . Asthma Son   . Allergies Son      HOME MEDICATIONS: Allergies as of 05/02/2020   No Known Allergies     Medication List       Accurate as of May 02, 2020 11:59 PM. If  you have any questions, ask your nurse or doctor.        STOP taking these medications   diclofenac 75 MG EC tablet Commonly known as: VOLTAREN Stopped by: Scarlette Shorts, MD   ferrous gluconate 324 MG tablet Commonly known as: FERGON Stopped by: Scarlette Shorts, MD     TAKE these medications   amLODipine 10 MG tablet Commonly known as: NORVASC TAKE 1 TABLET BY MOUTH EVERY DAY         OBJECTIVE:   PHYSICAL EXAM: VS: BP (!) 142/88   Pulse 97   Ht 5\' 3"  (1.6 m)   Wt 161 lb 9.6 oz (73.3 kg)   SpO2 98%   BMI 28.63 kg/m    EXAM: General: Pt appears well and is in NAD  Hydration: Well-hydrated with moist mucous membranes and good skin turgor  Neck: General: Supple without adenopathy. Thyroid: Thyroid size normal.  No goiter or nodules appreciated. No thyroid bruit.  Lungs: Clear with good BS bilat with no rales, rhonchi, or wheezes  Heart: Auscultation: RRR.  Abdomen: Normoactive bowel sounds, soft, nontender, without masses or organomegaly palpable  Extremities:  BL LE: No pretibial edema normal ROM and strength.  Mental Status: Judgment, insight: Intact Orientation: Oriented to time, place, and person  Mood and affect: No depression, anxiety, or agitation     DATA REVIEWED:  Results for GENISIS, SONNIER" (MRN [id number]) as of 05/03/2020 08:25  Ref. Range 02/01/2020 11:16 05/02/2020 14:08  TSH Latest Ref Range: 0.35 - 4.50 uIU/mL 2.01 0.93  Triiodothyronine (T3) Latest Ref Range: 76 - 181 ng/dL 010   U7,OZDG(UYQIHK) Latest Ref Range: 0.60 - 1.60 ng/dL 7.42 5.95  Thyroperoxidase Ab SerPl-aCnc Latest Ref Range: <9 IU/mL 17 (H)      ASSESSMENT / PLAN / RECOMMENDATIONS:   1. Hashimoto's Disease:   - I explained to the patient that Hashimoto's Disease is an autoimmune - mediated destruction of the thyroid gland. The usual course of Hashimoto's thyroiditis is the gradual loss of thyroid function. Overt hypothyroidism occurs at a rate of ~ 5% per  year.  - She is clinically and biochemically euthyroid - I would recommend annual TFT's or sooner if clinically indicated at PCP's office   F/U PRN    Signed electronically by: Lyndle Herrlich, MD  [facility]  [facility] [address] [place] [place], [place] [address] Phone: [phone] FAX: [phone]      CC: Patient, No Pcp Per No address on file Phone: None  Fax: None   Return to Endocrinology clinic as below: No future appointments.

## 2020-11-24 ENCOUNTER — Encounter: Payer: Self-pay | Admitting: Family Medicine

## 2020-11-24 ENCOUNTER — Ambulatory Visit: Payer: 59 | Admitting: Family Medicine

## 2020-11-24 ENCOUNTER — Other Ambulatory Visit: Payer: Self-pay

## 2020-11-24 VITALS — BP 157/102 | HR 72 | Temp 98.7°F | Ht 63.0 in | Wt 156.0 lb

## 2020-11-24 DIAGNOSIS — I10 Essential (primary) hypertension: Secondary | ICD-10-CM

## 2020-11-24 DIAGNOSIS — D649 Anemia, unspecified: Secondary | ICD-10-CM | POA: Diagnosis not present

## 2020-11-24 DIAGNOSIS — M542 Cervicalgia: Secondary | ICD-10-CM

## 2020-11-24 DIAGNOSIS — E063 Autoimmune thyroiditis: Secondary | ICD-10-CM

## 2020-11-24 MED ORDER — HYDROCHLOROTHIAZIDE 25 MG PO TABS: 25.0000 mg | ORAL_TABLET | Freq: Every day | ORAL | 3 refills | Status: AC

## 2020-11-24 MED ORDER — PREDNISONE 20 MG PO TABS: ORAL_TABLET | ORAL | 0 refills | Status: AC

## 2020-11-24 MED ORDER — DICLOFENAC SODIUM 75 MG PO TBEC: 75.0000 mg | DELAYED_RELEASE_TABLET | Freq: Two times a day (BID) | ORAL | 0 refills | Status: AC

## 2020-11-24 NOTE — Progress Notes (Signed)
2/28/202212:20 PM  Kelly Barrett [birth year], 40 y.o., female [id number]  Chief Complaint  Patient presents with  . stiff neck and pain at base of head    R arm pain , unable to lift over head , Woke this morning with symptoms     HPI:   Patient is a 40 y.o. female with past medical history significant for HTN who presents today for neck stiffness  Woke up this morning with a stiff neck Base of neck is sore Radiates down to neck Hurt to raise both arms this morning Has never happened in the past No strenuous activity yesterday Cannot turn head to right, only to left Intermittent tingling to hands   HTN Hasnt taken medications this morning Takes amlodipine 10 mg daily BP runs high at home as well BP Readings from Last 3 Encounters:  11/24/20 (!) 157/102  05/02/20 (!) 142/88  02/01/20 (!) 152/86     Depression screen PHQ 2/9 11/24/2020 09/14/2018 12/23/2017  Decreased Interest 0 0 0  Down, Depressed, Hopeless 0 0 0  PHQ - 2 Score 0 0 0    Fall Risk  11/24/2020 12/10/2019 09/14/2018 12/23/2017  Falls in the past year? 0 0 0 No  Number falls in past yr: 0 0 - -  Injury with Fall? 0 0 - -  Follow up Falls evaluation completed Falls evaluation completed - -     No Known Allergies  Prior to Admission medications   Medication Sig Start Date End Date Taking? Authorizing Provider  amLODipine (NORVASC) 10 MG tablet TAKE 1 TABLET BY MOUTH EVERY DAY 04/21/20  Yes Maximiano Coss, NP    Past Medical History:  Diagnosis Date  . Allergy   . Anemia   . Depression   . Fibromyalgia    DX back in HS 2002-2003  . History of HPV infection   . Hypertension   . Pyloric stenosis    repaired as infant    Past Surgical History:  Procedure Laterality Date  . CHOLECYSTECTOMY    . TUBAL LIGATION Bilateral 07/11/2014   Procedure: POST PARTUM TUBAL LIGATION;  Surgeon: Sanjuana Kava, MD;  Location: [facility];  Service: Gynecology;  Laterality: Bilateral;    Social History    Tobacco Use  . Smoking status: Never Smoker  . Smokeless tobacco: Never Used  Substance Use Topics  . Alcohol use: No    Family History  Problem Relation Age of Onset  . Hypertension Mother   . Diabetes Mother   . Diabetes Father   . Hypertension Sister   . Asthma Son   . Allergies Son     Review of Systems  Constitutional: Negative for chills, fever and malaise/fatigue.  Eyes: Negative for blurred vision and double vision.  Respiratory: Negative for cough, shortness of breath and wheezing.   Cardiovascular: Negative for chest pain, palpitations and leg swelling.  Gastrointestinal: Negative for abdominal pain, diarrhea, heartburn, nausea and vomiting.  Musculoskeletal: Positive for neck pain. Negative for back pain, joint pain and myalgias.  Skin: Negative for rash.  Neurological: Positive for tingling and headaches. Negative for dizziness, sensory change, focal weakness and weakness.     OBJECTIVE:  Today's Vitals   11/24/20 1049  BP: (!) 157/102  Pulse: 72  Temp: 98.7 F (37.1 C)  SpO2: 100%  Weight: 156 lb (70.8 kg)  Height: 5' 3"  (1.6 m)   Body mass index is 27.63 kg/m.   Physical Exam Constitutional:      General: She is  not in acute distress.    Appearance: Normal appearance. She is not ill-appearing.  HENT:     Head: Normocephalic.  Cardiovascular:     Rate and Rhythm: Normal rate and regular rhythm.     Pulses: Normal pulses.     Heart sounds: Normal heart sounds. No murmur heard. No friction rub. No gallop.   Pulmonary:     Effort: Pulmonary effort is normal. No respiratory distress.     Breath sounds: Normal breath sounds. No stridor. No wheezing, rhonchi or rales.  Abdominal:     General: Bowel sounds are normal.     Palpations: Abdomen is soft.     Tenderness: There is no abdominal tenderness.  Musculoskeletal:     Cervical back: Tenderness (Between neck and right shoulder) present. No swelling, deformity, erythema, rigidity or bony  tenderness. Pain with movement (painful to turn to the rigth) present.     Right lower leg: No edema.     Left lower leg: No edema.  Skin:    General: Skin is warm and dry.  Neurological:     Mental Status: She is alert and oriented to person, place, and time.  Psychiatric:        Mood and Affect: Mood normal.        Behavior: Behavior normal.     No results found for this or any previous visit (from the past 24 hour(s)).  No results found.   ASSESSMENT and PLAN  Problem List Items Addressed This Visit   None   Visit Diagnoses    Essential hypertension    -  Primary   Relevant Medications   hydrochlorothiazide (HYDRODIURIL) 25 MG tablet   Other Relevant Orders   CMP14+EGFR   Lipid Panel   Neck pain       Relevant Medications   predniSONE (DELTASONE) 20 MG tablet   diclofenac (VOLTAREN) 75 MG EC tablet   Hashimoto's disease       Relevant Orders   TSH   Anemia, unspecified type       Relevant Orders   CBC with Differential   Iron, TIBC and Ferritin Panel      Plan . Will follow up with lab results . Voltaren and prednisone for neck soreness (discussed alternative medicine including Massage and chiropractor) . Adding HCTZ to BP regimen, will monitor for goal< 130/80 . RTC/ED precautions provided   Return in about 3 months (around 02/21/2021), or if symptoms worsen or fail to improve.    Huston Foley Ruthann Angulo, FNP-BC [facility] [place], [place] [address] Ph.  [phone] Fax [phone]

## 2020-11-24 NOTE — Patient Instructions (Addendum)
Cervical Pain  Cervical radiculopathy means that a nerve in the neck (a cervical nerve) is pinched or bruised. This can happen because of an injury to the cervical spine (vertebrae) in the neck, or as a normal part of getting older. This can cause pain or loss of feeling (numbness) that runs from your neck all the way down to your arm and fingers. Often, this condition gets better with rest. Treatment may be needed if the condition does not get better. What are the causes?  A neck injury.  A bulging disk in your spine.  Muscle movements that you cannot control (muscle spasms).  Tight muscles in your neck due to overuse.  Arthritis.  Breakdown in the bones and joints of the spine (spondylosis) due to getting older.  Bone spurs that form near the nerves in the neck. What are the signs or symptoms?  Pain. The pain may: ? Run from the neck to the arm and hand. ? Be very bad or irritating. ? Be worse when you move your neck.  Loss of feeling or tingling in your arm or hand.  Weakness in your arm or hand, in very bad cases. How is this treated? In many cases, treatment is not needed for this condition. With rest, the condition often gets better over time. If treatment is needed, options may include:  Wearing a soft neck collar (cervical collar) for short periods of time, as told by your doctor.  Doing exercises (physical therapy) to strengthen your neck muscles.  Taking medicines.  Having shots (injections) in your spine, in very bad cases.  Having surgery. This may be needed if other treatments do not help. The type of surgery that is used depends on the cause of your condition. Follow these instructions at home: If you have a soft neck collar:  Wear it as told by your doctor. Remove it only as told by your doctor.  Ask your doctor if you can remove the collar for cleaning and bathing. If you are allowed to remove the collar for cleaning or bathing: ? Follow  instructions from your doctor about how to remove the collar safely. ? Clean the collar by wiping it with mild soap and water and drying it completely. ? Take out any removable pads in the collar every 1-2 days. Wash them by hand with soap and water. Let them air-dry completely before you put them back in the collar. ? Check your skin under the collar for redness or sores. If you see any, tell your doctor. Managing pain  Take over-the-counter and prescription medicines only as told by your doctor.  If told, put ice on the painful area. ? If you have a soft neck collar, remove it as told by your doctor. ? Put ice in a plastic bag. ? Place a towel between your skin and the bag. ? Leave the ice on for 20 minutes, 2-3 times a day.  If using ice does not help, you can try using heat. Use the heat source that your doctor recommends, such as a moist heat pack or a heating pad. ? Place a towel between your skin and the heat source. ? Leave the heat on for 20-30 minutes. ? Remove the heat if your skin turns bright red. This is very important if you are unable to feel pain, heat, or cold. You may have a greater risk of getting burned.  You may try a gentle neck and shoulder rub (massage).  Activity  Rest as needed.  Return to your normal activities as told by your doctor. Ask your doctor what activities are safe for you.  Do exercises as told by your doctor or physical therapist.  Do not lift anything that is heavier than 10 lb (4.5 kg) until your doctor tells you that it is safe. General instructions  Use a flat pillow when you sleep.  Do not drive while wearing a soft neck collar. If you do not have a soft neck collar, ask your doctor if it is safe to drive while your neck heals.  Ask your doctor if the medicine prescribed to you requires you to avoid driving or using heavy machinery.  Do not use any products that contain nicotine or tobacco, such as cigarettes, e-cigarettes, and  chewing tobacco. These can delay healing. If you need help quitting, ask your doctor.  Keep all follow-up visits as told by your doctor. This is important. Contact a doctor if:  Your condition does not get better with treatment. Get help right away if:  Your pain gets worse and is not helped with medicine.  You lose feeling or feel weak in your hand, arm, face, or leg.  You have a high fever.  You have a stiff neck.  You cannot control when you poop or pee (have incontinence).  You have trouble with walking, balance, or talking. Summary  Cervical radiculopathy means that a nerve in the neck is pinched or bruised.  A nerve can get pinched from a bulging disk, arthritis, an injury to the neck, or other causes.  Symptoms include pain, tingling, or loss of feeling that goes from the neck into the arm or hand.  Weakness in your arm or hand can happen in very bad cases.  Treatment may include resting, wearing a soft neck collar, and doing exercises. You might need to take medicines for pain. In very bad cases, shots or surgery may be needed. This information is not intended to replace advice given to you by your health care provider. Make sure you discuss any questions you have with your health care provider. Document Revised: 08/04/2018 Document Reviewed: 08/04/2018 Elsevier Patient Education  2021 [employer].  If you have lab work done today you will be contacted with your lab results within the next 2 weeks.  If you have not heard from [place] then please contact us. The fastest way to get your results is to register for My Chart.   IF you received an x-ray today, you will receive an invoice from [facility]. Please contact [facility] at [phone] with questions or concerns regarding your invoice.   IF you received labwork today, you will receive an invoice from [place]. Please contact [facility] at [phone] with questions or concerns regarding your invoice.    Our billing staff will not be able to assist you with questions regarding bills from these companies.  You will be contacted with the lab results as soon as they are available. The fastest way to get your results is to activate your My Chart account. Instructions are located on the last page of this paperwork. If you have not heard from [place] regarding the results in 2 weeks, please contact this office.

## 2020-11-25 ENCOUNTER — Other Ambulatory Visit: Payer: Self-pay | Admitting: Family Medicine

## 2020-11-25 DIAGNOSIS — D649 Anemia, unspecified: Secondary | ICD-10-CM

## 2020-11-25 LAB — CMP14+EGFR
ALT: 10 IU/L (ref 0–32)
AST: 12 IU/L (ref 0–40)
Albumin/Globulin Ratio: 1.6 (ref 1.2–2.2)
Albumin: 4.2 g/dL (ref 3.8–4.8)
Alkaline Phosphatase: 99 IU/L (ref 44–121)
BUN/Creatinine Ratio: 9 (ref 9–23)
BUN: 8 mg/dL (ref 6–20)
Bilirubin Total: <0.2 mg/dL (ref 0.0–1.2)
CO2: 22 mmol/L (ref 20–29)
Calcium: 9.3 mg/dL (ref 8.7–10.2)
Chloride: 104 mmol/L (ref 96–106)
Creatinine, Ser: 0.91 mg/dL (ref 0.57–1.00)
Globulin, Total: 2.7 g/dL (ref 1.5–4.5)
Glucose: 91 mg/dL (ref 65–99)
Potassium: 4.1 mmol/L (ref 3.5–5.2)
Sodium: 140 mmol/L (ref 134–144)
Total Protein: 6.9 g/dL (ref 6.0–8.5)
eGFR: 83 mL/min/{1.73_m2} (ref >59)

## 2020-11-25 LAB — CBC WITH DIFFERENTIAL/PLATELET
Basophils Absolute: 0 10*3/uL (ref 0.0–0.2)
Basos: 0 %
EOS (ABSOLUTE): 0.2 10*3/uL (ref 0.0–0.4)
Eos: 2 %
Hematocrit: 32.1 % — ABNORMAL LOW (ref 34.0–46.6)
Hemoglobin: 9.2 g/dL — ABNORMAL LOW (ref 11.1–15.9)
Immature Grans (Abs): 0 10*3/uL (ref 0.0–0.1)
Immature Granulocytes: 0 %
Lymphocytes Absolute: 2.1 10*3/uL (ref 0.7–3.1)
Lymphs: 23 %
MCH: 20.5 pg — ABNORMAL LOW (ref 26.6–33.0)
MCHC: 28.7 g/dL — ABNORMAL LOW (ref 31.5–35.7)
MCV: 72 fL — ABNORMAL LOW (ref 79–97)
Monocytes Absolute: 0.4 10*3/uL (ref 0.1–0.9)
Monocytes: 4 %
Neutrophils Absolute: 6.6 10*3/uL (ref 1.4–7.0)
Neutrophils: 71 %
Platelets: 485 10*3/uL — ABNORMAL HIGH (ref 150–450)
RBC: 4.48 x10E6/uL (ref 3.77–5.28)
RDW: 15.9 % — ABNORMAL HIGH (ref 11.7–15.4)
WBC: 9.3 10*3/uL (ref 3.4–10.8)

## 2020-11-25 LAB — IRON,TIBC AND FERRITIN PANEL
Ferritin: 6 ng/mL — ABNORMAL LOW (ref 15–150)
Iron Saturation: 4 % — CL (ref 15–55)
Iron: 19 ug/dL — ABNORMAL LOW (ref 27–159)
Total Iron Binding Capacity: 449 ug/dL (ref 250–450)
UIBC: 430 ug/dL — ABNORMAL HIGH (ref 131–425)

## 2020-11-25 LAB — TSH: TSH: 1.37 u[IU]/mL (ref 0.450–4.500)

## 2020-11-25 LAB — LIPID PANEL
Chol/HDL Ratio: 3 ratio (ref 0.0–4.4)
Cholesterol, Total: 152 mg/dL (ref 100–199)
HDL: 50 mg/dL (ref >39)
LDL Chol Calc (NIH): 84 mg/dL (ref 0–99)
Triglycerides: 98 mg/dL (ref 0–149)
VLDL Cholesterol Cal: 18 mg/dL (ref 5–40)

## 2020-11-25 MED ORDER — FERROUS SULFATE 324 MG PO TBEC: 324.0000 mg | DELAYED_RELEASE_TABLET | Freq: Every day | ORAL | 3 refills | Status: AC

## 2020-11-25 NOTE — Progress Notes (Signed)
Overall Labs look good. You are very anemic. I would recommend daily ferrous sulfate 324 mg. I have sent this to your pharmacy. Make sure you increase your fiber and fluid intake while on this medication to prevent constipation. Let me know if you have any questions or concerns.

## 2021-01-13 ENCOUNTER — Other Ambulatory Visit: Payer: Self-pay | Admitting: Registered Nurse

## 2021-01-13 NOTE — Telephone Encounter (Signed)
Needs OV to est care before further fills.

## 2021-03-29 ENCOUNTER — Other Ambulatory Visit: Payer: Self-pay | Admitting: Registered Nurse

## 2021-07-01 ENCOUNTER — Other Ambulatory Visit: Payer: Self-pay | Admitting: Registered Nurse

## 2022-12-31 ENCOUNTER — Other Ambulatory Visit: Payer: Self-pay | Admitting: Obstetrics and Gynecology

## 2022-12-31 DIAGNOSIS — N6310 Unspecified lump in the right breast, unspecified quadrant: Secondary | ICD-10-CM

## 2023-01-14 ENCOUNTER — Ambulatory Visit
Admission: RE | Admit: 2023-01-14 | Discharge: 2023-01-14 | Disposition: A | Discharge status: Home / Self Care | Payer: 59 | Source: Ambulatory Visit | Attending: Obstetrics and Gynecology | Admitting: Obstetrics and Gynecology

## 2023-01-14 DIAGNOSIS — N6310 Unspecified lump in the right breast, unspecified quadrant: Secondary | ICD-10-CM
